# Patient Record
Sex: Male | Born: 1965 | Race: White | Hispanic: No | Marital: Single | Smoking: Never smoker
Health system: Southern US, Community
[De-identification: ages and names within clinical notes are randomized; demographics above are authoritative.]

## PROBLEM LIST (undated history)

## (undated) DIAGNOSIS — I10 Essential (primary) hypertension: Secondary | ICD-10-CM

## (undated) HISTORY — DX: Essential (primary) hypertension: I10

---

## 2002-08-12 ENCOUNTER — Encounter: Payer: Self-pay | Admitting: Family Medicine

## 2002-08-12 ENCOUNTER — Ambulatory Visit (HOSPITAL_COMMUNITY): Admission: RE | Admit: 2002-08-12 | Discharge: 2002-08-12 | Payer: Self-pay | Admitting: Family Medicine

## 2002-11-07 ENCOUNTER — Encounter: Payer: Self-pay | Admitting: Neurosurgery

## 2002-11-09 ENCOUNTER — Encounter: Payer: Self-pay | Admitting: Neurosurgery

## 2002-11-09 ENCOUNTER — Inpatient Hospital Stay (HOSPITAL_COMMUNITY): Admission: RE | Admit: 2002-11-09 | Discharge: 2002-11-10 | Payer: Self-pay | Admitting: Neurosurgery

## 2009-07-09 ENCOUNTER — Ambulatory Visit: Payer: Self-pay | Admitting: Family Medicine

## 2009-07-09 DIAGNOSIS — I10 Essential (primary) hypertension: Secondary | ICD-10-CM | POA: Insufficient documentation

## 2009-07-18 ENCOUNTER — Telehealth: Payer: Self-pay | Admitting: Family Medicine

## 2009-07-25 ENCOUNTER — Ambulatory Visit: Payer: Self-pay | Admitting: Family Medicine

## 2009-07-26 LAB — CONVERTED CEMR LAB
ALT: 172 units/L — ABNORMAL HIGH (ref 0–53)
AST: 92 units/L — ABNORMAL HIGH (ref 0–37)
Albumin: 4.4 g/dL (ref 3.5–5.2)
Alkaline Phosphatase: 59 units/L (ref 39–117)
BUN: 13 mg/dL (ref 6–23)
Basophils Absolute: 0 10*3/uL (ref 0.0–0.1)
Basophils Relative: 0.3 % (ref 0.0–3.0)
Bilirubin, Direct: 0 mg/dL (ref 0.0–0.3)
CO2: 28 meq/L (ref 19–32)
Calcium: 9.3 mg/dL (ref 8.4–10.5)
Chloride: 102 meq/L (ref 96–112)
Cholesterol: 240 mg/dL — ABNORMAL HIGH (ref 0–200)
Creatinine, Ser: 0.9 mg/dL (ref 0.4–1.5)
Direct LDL: 188.8 mg/dL
Eosinophils Absolute: 0.2 10*3/uL (ref 0.0–0.7)
Eosinophils Relative: 3 % (ref 0.0–5.0)
GFR calc non Af Amer: 96.76 mL/min (ref >60)
Glucose, Bld: 101 mg/dL — ABNORMAL HIGH (ref 70–99)
HCT: 45.3 % (ref 39.0–52.0)
HDL: 40.8 mg/dL (ref >39.00)
Hemoglobin: 15 g/dL (ref 13.0–17.0)
Lymphocytes Relative: 46.5 % — ABNORMAL HIGH (ref 12.0–46.0)
Lymphs Abs: 3.1 10*3/uL (ref 0.7–4.0)
MCHC: 33.1 g/dL (ref 30.0–36.0)
MCV: 94.8 fL (ref 78.0–100.0)
Monocytes Absolute: 0.6 10*3/uL (ref 0.1–1.0)
Monocytes Relative: 9 % (ref 3.0–12.0)
Neutro Abs: 2.7 10*3/uL (ref 1.4–7.7)
Neutrophils Relative %: 41.2 % — ABNORMAL LOW (ref 43.0–77.0)
PSA: 0.43 ng/mL (ref 0.10–4.00)
Platelets: 238 10*3/uL (ref 150.0–400.0)
Potassium: 4.3 meq/L (ref 3.5–5.1)
RBC: 4.78 M/uL (ref 4.22–5.81)
RDW: 12.7 % (ref 11.5–14.6)
Sodium: 137 meq/L (ref 135–145)
TSH: 2.68 microintl units/mL (ref 0.35–5.50)
Total Bilirubin: 0.7 mg/dL (ref 0.3–1.2)
Total CHOL/HDL Ratio: 6
Total Protein: 7.6 g/dL (ref 6.0–8.3)
Triglycerides: 136 mg/dL (ref 0.0–149.0)
VLDL: 27.2 mg/dL (ref 0.0–40.0)
WBC: 6.6 10*3/uL (ref 4.5–10.5)

## 2009-09-26 ENCOUNTER — Telehealth: Payer: Self-pay | Admitting: Family Medicine

## 2009-09-28 ENCOUNTER — Ambulatory Visit: Payer: Self-pay | Admitting: Family Medicine

## 2010-02-21 ENCOUNTER — Ambulatory Visit: Payer: Self-pay | Admitting: Family Medicine

## 2010-06-25 NOTE — Progress Notes (Signed)
Summary: Bp meds  Phone Note Call from Patient Call back at [phone]   Caller: Patient Summary of Call: Pts wife called and stated that her husband is out of his medications- he has been taking 2 a day. They would like samples. Is this fine? Army Fossa CMA  July 18, 2009 4:07 PM   Follow-up for Phone Call        Per dr Laury Axon give pt samples of Exforge 10/320 mg and only take 1 a day. Pt is aware that meds are up front.Army Fossa CMA  July 19, 2009 10:29 AM     New/Updated Medications: EXFORGE 10-320 MG TABS (AMLODIPINE BESYLATE-VALSARTAN) 1 by mouth daily

## 2010-06-25 NOTE — Assessment & Plan Note (Signed)
Summary: NEW ACUTE PER LOWNE/CDJ   Vital Signs:  Patient profile:   45 year old male Height:      71 inches Weight:      263 pounds BMI:     36.81 Temp:     97.8 degrees F oral Pulse rate:   82 / minute Pulse rhythm:   regular BP sitting:   178 / 110  (left arm) Cuff size:   large  Vitals Entered By: Army Fossa CMA (July 09, 2009 9:56 AM) CC: Pt here c/o Elevated BP   History of Present Illness:  Hypertension follow-up      This is a 45 year old man who presents for Hypertension follow-up.  The symptoms began 2004 ago.  Pt here to establish and check BP.  BP has been elevated for the last week.  The patient denies lightheadedness, urinary frequency, headaches, edema, impotence, rash, and fatigue.  The patient denies the following associated symptoms: chest pain, chest pressure, exercise intolerance, dyspnea, palpitations, syncope, leg edema, and pedal edema.  Compliance with medications (by patient report) has been near 100%.  The patient reports that dietary compliance has been good.  The patient reports no exercise.  Adjunctive measures currently used by the patient include salt restriction.    Preventive Screening-Counseling & Management  Alcohol-Tobacco     Alcohol drinks/day: 0     Smoking Status: never  Caffeine-Diet-Exercise     Caffeine use/day: 2     Does Patient Exercise: yes     Type of exercise: running      Sexual History:  currently monogamous and married.        Drug Use:  no.    Current Medications (verified): 1)  Lisinopril 20 Mg Tabs (Lisinopril) .Marland Kitchen.. 1 By Mouth Daily  Allergies (verified): No Known Drug Allergies  Past History:  Social History: Last updated: 07/09/2009 Married Never Smoked Alcohol use-yes Drug use-no Regular exercise-yes  Risk Factors: Alcohol Use: 0 (07/09/2009) Caffeine Use: 2 (07/09/2009) Exercise: yes (07/09/2009)  Risk Factors: Smoking Status: never (07/09/2009)  Past Medical History: Hypertension--  pt  had genetic testing at [facility] and was told he had 100% chance of getting htn.    Family History: No family hx of HTN  Social History: Married Never Smoked Alcohol use-yes Drug use-no Regular exercise-yes Smoking Status:  never Drug Use:  no Does Patient Exercise:  yes Caffeine use/day:  2 Sexual History:  currently monogamous, married  Review of Systems      See HPI  Physical Exam  General:  Well-developed,well-nourished,in no acute distress; alert,appropriate and cooperative throughout examination Neck:  No deformities, masses, or tenderness noted. Lungs:  Normal respiratory effort, chest expands symmetrically. Lungs are clear to auscultation, no crackles or wheezes. Heart:  normal rate and no murmur.   Extremities:  No clubbing, cyanosis, edema, or deformity noted with normal full range of motion of all joints.   Psych:  Oriented X3, normally interactive, good eye contact, not anxious appearing, and not depressed appearing.     Impression & Recommendations:  Problem # 1:  HYPERTENSION (ICD-401.9) Assessment Deteriorated  The following medications were removed from the medication list:    Lisinopril 20 Mg Tabs (Lisinopril) .Marland Kitchen... 1 by mouth daily His updated medication list for this problem includes:    Exforge 5-160 Mg Tabs (Amlodipine besylate-valsartan) .Marland Kitchen... 1 by mouth once daily  BP today: 178/110  Orders: EKG w/ Interpretation (93000)  Complete Medication List: 1)  Exforge 5-160 Mg Tabs (Amlodipine besylate-valsartan) .Marland KitchenMarland KitchenMarland Kitchen  1 by mouth once daily  Patient Instructions: 1)  rto 2-3 weeks for ov and fasting labs   EKG  Procedure date:  07/09/2009  Findings:      Normal sinus rhythm with rate of:  71 bpm

## 2010-06-25 NOTE — Assessment & Plan Note (Signed)
Summary: Refill on BP meds/drb   Vital Signs:  Patient profile:   45 year old male Weight:      66.8 pounds Pulse rate:   80 / minute Pulse rhythm:   regular BP sitting:   130 / 80  (right arm) Cuff size:   large  Vitals Entered By: Almeta Monas CMA Duncan Dull) (February 21, 2010 10:09 AM) CC: F/U on BP meds    History of Present Illness:  Hypertension follow-up      This is a 45 year old man who presents for Hypertension follow-up.  The patient denies lightheadedness, urinary frequency, headaches, edema, impotence, rash, and fatigue.  The patient denies the following associated symptoms: chest pain, chest pressure, exercise intolerance, dyspnea, palpitations, syncope, leg edema, and pedal edema.  Compliance with medications (by patient report) has been near 100%.  The patient reports that dietary compliance has been good.  The patient reports exercising occasionally.  Adjunctive measures currently used by the patient include salt restriction.    Current Medications (verified): 1)  Exforge 10-320 Mg Tabs (Amlodipine Besylate-Valsartan) .Marland Kitchen.. 1 By Mouth Daily 2)  Bystolic 5 Mg Tabs (Nebivolol Hcl) .Marland Kitchen.. 1 By Mouth Once Daily  Allergies (verified): No Known Drug Allergies  Past History:  Past medical, surgical, family and social histories (including risk factors) reviewed for relevance to current acute and chronic problems.  Past Medical History: Reviewed history from 07/09/2009 and no changes required. Hypertension--  pt had genetic testing at [facility] and was told he had 100% chance of getting htn.    Family History: Reviewed history from 07/09/2009 and no changes required. No family hx of HTN  Social History: Reviewed history from 07/09/2009 and no changes required. Married Never Smoked Alcohol use-yes Drug use-no Regular exercise-yes  Review of Systems      See HPI  Physical Exam  General:  Well-developed,well-nourished,in no acute distress; alert,appropriate and  cooperative throughout examination Lungs:  Normal respiratory effort, chest expands symmetrically. Lungs are clear to auscultation, no crackles or wheezes. Heart:  Normal rate and regular rhythm. S1 and S2 normal without gallop, murmur, click, rub or other extra sounds. Extremities:  No clubbing, cyanosis, edema, or deformity noted with normal full range of motion of all joints.   Skin:  Intact without suspicious lesions or rashes Psych:  Cognition and judgment appear intact. Alert and cooperative with normal attention span and concentration. No apparent delusions, illusions, hallucinations   Impression & Recommendations:  Problem # 1:  HYPERTENSION (ICD-401.9)  His updated medication list for this problem includes:    Exforge 10-320 Mg Tabs (Amlodipine besylate-valsartan) .Marland Kitchen... 1 by mouth daily    Bystolic 5 Mg Tabs (Nebivolol hcl) .Marland Kitchen... 1 by mouth once daily  BP today: 130/80 Prior BP: 132/90 (09/28/2009)  Labs Reviewed: K+: 4.3 (07/25/2009) Creat: : 0.9 (07/25/2009)   Chol: 240 (07/25/2009)   HDL: 40.80 (07/25/2009)   TG: 136.0 (07/25/2009)  Complete Medication List: 1)  Exforge 10-320 Mg Tabs (Amlodipine besylate-valsartan) .Marland Kitchen.. 1 by mouth daily 2)  Bystolic 5 Mg Tabs (Nebivolol hcl) .Marland Kitchen.. 1 by mouth once daily  Patient Instructions: 1)  Please schedule a follow-up appointment in 6 months .  Prescriptions: BYSTOLIC 5 MG TABS (NEBIVOLOL HCL) 1 by mouth once daily  #30 x 5   Entered and Authorized by:   Loreen Freud DO   Signed by:   Loreen Freud DO on 02/21/2010   Method used:   Electronically to        [facility]  [facility] [id number] (retail)       [address]       [place], [place]  [address]       Ph: [phone]       Fax: [phone]   RxID:   [id number] EXFORGE 10-320 MG TABS (AMLODIPINE BESYLATE-VALSARTAN) 1 by mouth daily  #90 x 3   Entered and Authorized by:   Loreen Freud DO   Signed by:   Loreen Freud DO on 02/21/2010   Method used:    Electronically to        [facility] [id number] (retail)       [address]       [place], [place]  [address]       Ph: [phone]       Fax: [phone]   RxID:   [id number]

## 2010-06-25 NOTE — Assessment & Plan Note (Signed)
Summary: Follow up on BP/drb   Vital Signs:  Patient profile:   45 year old male Weight:      264 pounds Pulse rate:   80 / minute Pulse rhythm:   regular BP sitting:   132 / 90  (left arm) Cuff size:   large  Vitals Entered By: Army Fossa CMA (Sep 28, 2009 10:50 AM) CC: Pt here to follow up on BP meds- runs high when he is not taking both Exforge and Lisnopril   History of Present Illness: Pt here for bp check.  He was taking exforge and lisinopril and we thought he was taking just exforge.  His bp is up with just exforge.  Allergies (verified): No Known Drug Allergies  Physical Exam  General:  Well-developed,well-nourished,in no acute distress; alert,appropriate and cooperative throughout examination Psych:  Oriented X3 and normally interactive.     Impression & Recommendations:  Problem # 1:  HYPERTENSION (ICD-401.9)  His updated medication list for this problem includes:    Exforge 10-320 Mg Tabs (Amlodipine besylate-valsartan) .Marland Kitchen... 1 by mouth daily    Lisinopril 20 Mg Tabs (Lisinopril) .Marland Kitchen... 1 by mouth once daily  BP today: 132/90 Prior BP: 120/78 (07/25/2009)  Labs Reviewed: K+: 4.3 (07/25/2009) Creat: : 0.9 (07/25/2009)   Chol: 240 (07/25/2009)   HDL: 40.80 (07/25/2009)   TG: 136.0 (07/25/2009)  Complete Medication List: 1)  Exforge 10-320 Mg Tabs (Amlodipine besylate-valsartan) .Marland Kitchen.. 1 by mouth daily 2)  Lisinopril 20 Mg Tabs (Lisinopril) .Marland Kitchen.. 1 by mouth once daily  Patient Instructions: 1)  recheck all labs in June---see last labs Prescriptions: LISINOPRIL 20 MG TABS (LISINOPRIL) 1 by mouth once daily  #90 x 3   Entered and Authorized by:   Loreen Freud DO   Signed by:   Loreen Freud DO on 09/28/2009   Method used:   Electronically to        [facility] [address] [id number] (retail)       [address]       [place], [place]  [address]       Ph: [phone]       Fax: [phone]   RxID:   [id number]   Appended Document: Orders  Update need to stop lisinopril---do not want to combine with exforge---change to bystolic 5 mg once daily ---f/u in 1 month   Clinical Lists Changes  Medications: Changed medication from LISINOPRIL 20 MG TABS (LISINOPRIL) 1 by mouth once daily to BYSTOLIC 5 MG TABS (NEBIVOLOL HCL) 1 by mouth once daily - Signed Rx of BYSTOLIC 5 MG TABS (NEBIVOLOL HCL) 1 by mouth once daily;  #30 x 2;  Signed;  Entered by: Loreen Freud DO;  Authorized by: Loreen Freud DO;  Method used: Print then Give to Patient    Prescriptions: BYSTOLIC 5 MG TABS (NEBIVOLOL HCL) 1 by mouth once daily  #30 x 2   Entered and Authorized by:   Loreen Freud DO   Signed by:   Loreen Freud DO on 10/09/2009   Method used:   Print then Give to Patient   RxID:   [id number]

## 2010-06-25 NOTE — Progress Notes (Signed)
Summary: BP MEDS   Phone Note Call from Patient   Caller: Spouse Summary of Call: Pt wife called requesting lisinopril---  he is supposed to just be on exforge .  I d/w wife a few weeks ago.   Initial call taken by: Loreen Freud DO,  Sep 26, 2009 12:11 PM  Follow-up for Phone Call        Left message to call back, need to know exactly what pt is taking. Army Fossa CMA  Sep 26, 2009 12:58 PM   Additional Follow-up for Phone Call Additional follow up Details #1::        LMTCB. Army Fossa CMA  Sep 27, 2009 10:09 AM     Additional Follow-up for Phone Call Additional follow up Details #2::    Pt states that his wife told him that he was to take the Lisinopril and Exforge- pt is aware he is just take Exforge. Army Fossa CMA  Sep 27, 2009 11:50 AM   Additional Follow-up for Phone Call Additional follow up Details #3:: Details for Additional Follow-up Action Taken: he needs f/u appointment   Spoke with pts wife and he says when just taking the Exforge his BP is up. She scheduled appt for him tomorrow. Army Fossa CMA  Sep 27, 2009 12:43 PM  Additional Follow-up by: Loreen Freud DO,  Sep 27, 2009 12:12 PM

## 2010-06-25 NOTE — Assessment & Plan Note (Signed)
Summary: 2-3 week roa//lch   Vital Signs:  Patient profile:   45 year old male Weight:      259 pounds Pulse rate:   88 / minute Pulse rhythm:   regular BP sitting:   120 / 78  (left arm) Cuff size:   large  Vitals Entered By: Army Fossa CMA (July 25, 2009 10:32 AM) CC: Pt here follow up on BP and labwork- pt is fasting   History of Present Illness:  Hypertension follow-up      This is a 45 year old man who presents for Hypertension follow-up.  The patient denies lightheadedness, urinary frequency, headaches, edema, impotence, rash, and fatigue.  The patient denies the following associated symptoms: chest pain, chest pressure, exercise intolerance, dyspnea, palpitations, syncope, leg edema, and pedal edema.  Compliance with medications (by patient report) has been near 100%.  The patient reports that dietary compliance has been good.  The patient reports exercising 3-4X per week.  Adjunctive measures currently used by the patient include salt restriction.    Current Medications (verified): 1)  Exforge 10-320 Mg Tabs (Amlodipine Besylate-Valsartan) .Marland Kitchen.. 1 By Mouth Daily  Allergies (verified): No Known Drug Allergies  Past History:  Past medical, surgical, family and social histories (including risk factors) reviewed for relevance to current acute and chronic problems.  Past Medical History: Reviewed history from 07/09/2009 and no changes required. Hypertension--  pt had genetic testing at [facility] and was told he had 100% chance of getting htn.    Family History: Reviewed history from 07/09/2009 and no changes required. No family hx of HTN  Social History: Reviewed history from 07/09/2009 and no changes required. Married Never Smoked Alcohol use-yes Drug use-no Regular exercise-yes  Review of Systems      See HPI  Physical Exam  General:  Well-developed,well-nourished,in no acute distress; alert,appropriate and cooperative throughout examination Lungs:  Normal  respiratory effort, chest expands symmetrically. Lungs are clear to auscultation, no crackles or wheezes. Heart:  normal rate and no murmur.   Extremities:  No clubbing, cyanosis, edema, or deformity noted with normal full range of motion of all joints.   Skin:  Intact without suspicious lesions or rashes Cervical Nodes:  No lymphadenopathy noted Psych:  Cognition and judgment appear intact. Alert and cooperative with normal attention span and concentration. No apparent delusions, illusions, hallucinations   Impression & Recommendations:  Problem # 1:  HYPERTENSION (ICD-401.9) Assessment Improved  His updated medication list for this problem includes:    Exforge 10-320 Mg Tabs (Amlodipine besylate-valsartan) .Marland Kitchen... 1 by mouth daily  Orders: Venipuncture ([address]) TLB-Lipid Panel (80061-LIPID) TLB-BMP (Basic Metabolic Panel-BMET) (80048-METABOL) TLB-CBC Platelet - w/Differential (85025-CBCD) TLB-Hepatic/Liver Function Pnl (80076-HEPATIC) TLB-TSH (Thyroid Stimulating Hormone) (84443-TSH) TLB-PSA (Prostate Specific Antigen) (84153-PSA)  BP today: 120/78 Prior BP: 178/110 (07/09/2009)  Complete Medication List: 1)  Exforge 10-320 Mg Tabs (Amlodipine besylate-valsartan) .Marland Kitchen.. 1 by mouth daily  Patient Instructions: 1)  Please schedule a follow-up appointment in 3 months .  Prescriptions: EXFORGE 10-320 MG TABS (AMLODIPINE BESYLATE-VALSARTAN) 1 by mouth daily  #30 x 3   Entered and Authorized by:   Loreen Freud DO   Signed by:   Loreen Freud DO on 07/25/2009   Method used:   Electronically to        [facility] [phone]* (retail)       [address]       [place], [place]  [address]  Ph: [phone]       Fax: [phone]   RxID:   [id number]

## 2010-10-04 ENCOUNTER — Other Ambulatory Visit: Payer: Self-pay | Admitting: Family Medicine

## 2010-10-11 NOTE — Op Note (Signed)
NAME:  Brad Ellis, Brad Ellis NO.:  [id number]   MEDICAL RECORD NO.:  [id number]                   PATIENT TYPE:  INP   LOCATION:  2899                                 FACILITY:  [facility]   PHYSICIAN:  Hilda Lias, M.D.                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   PROCEDURE:  Anterior C5-6 diskectomy and decompression of the spinal cord,  bilateral foraminotomy, removal of free fragment on the left side.  Interbody fusion with Allograft from C5-6.  Microscopic.   SURGEON:  Hilda Lias, M.D.   ASSISTANT:  Payton Doughty, M.D.   INDICATIONS:  The patient is being admitted because of neck and left upper  extremity pain.  The patient has failed conservative treatment.  MRI shows a  large herniated disk with displacement of his spinal cord bilaterally, left  worse than right.  Surgery was advised.   DESCRIPTION OF PROCEDURE:  The patient was taken to the OR and he was  positioned in the supine manner.  The neck was prepped with Betadine.  He  had a thick neck.  Incision was made in the left side through the skin,  platysma down to the cervical spine.  We had to do x-ray before we were able  to see that we were at the level of C5-6.  It was difficult to see below C5.  With incision of the ligament, we brought the microscope into the area.  That was the only way we could see well with lights and because of the  thickness of the neck.  With the microcurette, we did a total diskectomy.  We opened the posterior ligament, and there was quite a bit of spondylosis  but to the left side there were three to four large fragments compromising  the C6 level.  Decompression was made.  The posterior leaflet was removed.  A foraminotomy was accomplished bilaterally.  Then the end-plate was  trimmed.  A piece of bone graft in the mid __________ was inserted.  __________ the x-ray with the nerve hook we were able to see that there was  plenty of space between the posterior part of the bone graft and the spinal  cord.  From then on, the area was irrigated.  Hemostasis was obtained.  Lateral C-spine showed the plate was in good position.  From then on, the  area was irrigated and closed with Vicryl and Steri-Strips.  Hilda Lias, M.D.    EB/MEDQ  D:  11/09/2002  T:  11/09/2002  Job:  [id number]

## 2010-10-11 NOTE — H&P (Signed)
NAME:  Brad Ellis, Brad Ellis NO.:  [id number]   MEDICAL RECORD NO.:  [id number]                   PATIENT TYPE:  INP   LOCATION:  NA                                   FACILITY:  [facility]   PHYSICIAN:  Hilda Lias, M.D.                DATE OF BIRTH:  October 28, 1965   DATE OF ADMISSION:  11/09/2002  DATE OF DISCHARGE:                                HISTORY & PHYSICAL   Brad Ellis is a gentleman who came to see me about two months ago in my  office complaining of neck pain with radiation to the left arm muscles at  the level of the deltoid and biceps.  He felt the pain is getting worse.  He  just moved from [place] a few months ago.  On moving his furniture, he  developed a shooting pain down to the left arm associated with pain of the  neck.  He has had conservative treatment.  He had been seen by a physician  at [facility].  An MRI was obtained and because of the persistent pain, he wanted  to proceed with surgery.  The patient had been taking quite a bit of  Percocet and diazepam and despite taking this quantity, he is not any  better.  Finally, he decided he wanted to proceed with the surgical  decompression.   PAST MEDICAL HISTORY:  Negative.   ALLERGIES:  He is not allergic to any medications.   SOCIAL HISTORY:  He drinks socially.  He does not smoke.  He is 5 feet 10  inches, 230 pounds.   FAMILY HISTORY:  Negative.   REVIEW OF SYSTEMS:  Positive for neck and left arm pain.   PHYSICAL EXAMINATION:  HEENT:  Head is normal.  NECK:  He is able to flex but extension causes pain going to the left  shoulder.  LUNGS:  Clear.  HEART:  Sounds normal with normal activity and normal pulse.  _________.  EXTREMITIES:  Normal except I can break easily the left biceps and the left  __________ 3/5.  SENSATION:  Complains of a tingling sensation of the left thumb and index  finger.  REFLEXES:  Symmetrical but absent in the left biceps.  COORDINATION:  Gait  normal.   MRI showed that indeed he has a right herniated disk at the level of 5-6  with the canal being 8 mm.   IMPRESSION:  The patient is being admitted for surgery.  Procedure will be  anterior cervical diskectomy at C5-6 with a bone graft and plate and screws.  He knows about the risks of infection, CSF leak, worsening pain, damage to  the vocal cord, damage to the carotid and vertebral arteries, failure of the  bone graft, need for __________ surgery.  Hilda Lias, M.D.   EB/MEDQ  D:  11/09/2002  T:  11/09/2002  Job:  [id number]

## 2011-03-22 ENCOUNTER — Other Ambulatory Visit: Payer: Self-pay | Admitting: Family Medicine

## 2011-03-24 NOTE — Telephone Encounter (Signed)
Letter mailed     KP 

## 2013-02-21 ENCOUNTER — Telehealth: Payer: Self-pay | Admitting: Family Medicine

## 2013-02-21 ENCOUNTER — Other Ambulatory Visit: Payer: Self-pay | Admitting: Family Medicine

## 2013-02-21 DIAGNOSIS — I10 Essential (primary) hypertension: Secondary | ICD-10-CM

## 2013-02-21 MED ORDER — NEBIVOLOL HCL 5 MG PO TABS: ORAL_TABLET | ORAL | Status: DC

## 2013-02-21 MED ORDER — AMLODIPINE BESYLATE-VALSARTAN 10-320 MG PO TABS: ORAL_TABLET | ORAL | Status: DC

## 2013-02-21 NOTE — Telephone Encounter (Signed)
Patient's wife is calling in regards to the patient Bystolic BP medication. She is asking for it to be refilled. Advised her that the patient had not been seen since 2011 and provider would most likely decline any refills until patient is seen. Wife laughed and says, "well I need you to ask her for me." They are leaving for a funeral this afternoon and patient cannot come in until next week. Please advise.

## 2013-02-21 NOTE — Telephone Encounter (Signed)
Letter mailed to schedule a CPE.      KP 

## 2013-02-27 ENCOUNTER — Other Ambulatory Visit: Payer: Self-pay | Admitting: Family Medicine

## 2013-02-27 DIAGNOSIS — I10 Essential (primary) hypertension: Secondary | ICD-10-CM

## 2013-02-27 MED ORDER — AMLODIPINE BESYLATE-VALSARTAN 10-320 MG PO TABS: ORAL_TABLET | ORAL | Status: DC

## 2013-03-02 ENCOUNTER — Ambulatory Visit (INDEPENDENT_AMBULATORY_CARE_PROVIDER_SITE_OTHER): Payer: BC Managed Care – PPO | Admitting: Family Medicine

## 2013-03-02 ENCOUNTER — Encounter: Payer: Self-pay | Admitting: Family Medicine

## 2013-03-02 VITALS — BP 150/96 | HR 78 | Temp 98.1°F | Wt 229.2 lb

## 2013-03-02 DIAGNOSIS — Z23 Encounter for immunization: Secondary | ICD-10-CM

## 2013-03-02 DIAGNOSIS — I10 Essential (primary) hypertension: Secondary | ICD-10-CM

## 2013-03-02 MED ORDER — NEBIVOLOL HCL 10 MG PO TABS: 10.0000 mg | ORAL_TABLET | Freq: Every day | ORAL | Status: DC

## 2013-03-02 NOTE — Patient Instructions (Signed)

## 2013-03-02 NOTE — Progress Notes (Signed)
  Subjective:    Patient here for follow-up of elevated blood pressure.  He is exercising and is adherent to a low-salt diet.  Blood pressure is not well controlled at home. Cardiac symptoms: none. Patient denies: chest pain, chest pressure/discomfort, claudication, dyspnea, exertional chest pressure/discomfort, fatigue, irregular heart beat, lower extremity edema, near-syncope, orthopnea, palpitations, paroxysmal nocturnal dyspnea, syncope and tachypnea. Cardiovascular risk factors: hypertension, male gender and obesity (BMI >= 30 kg/m2). Use of agents associated with hypertension: none. History of target organ damage: none.  The following portions of the patient's history were reviewed and updated as appropriate: allergies, current medications, past family history, past medical history, past social history, past surgical history and problem list.  Review of Systems Pertinent items are noted in HPI.     Objective:    BP 150/96  Pulse 78  Temp(Src) 98.1 F (36.7 C) (Oral)  Wt 229 lb 3.2 oz (103.964 kg)  BMI 31.98 kg/m2  SpO2 98% General appearance: alert, cooperative, appears stated age and no distress Head: Normocephalic, without obvious abnormality, atraumatic Neck: no adenopathy, no carotid bruit, no JVD, supple, symmetrical, trachea midline and thyroid not enlarged, symmetric, no tenderness/mass/nodules Lungs: clear to auscultation bilaterally Heart: S1, S2 normal Extremities: extremities normal, atraumatic, no cyanosis or edema    Assessment:    Hypertension, stage 1 . Evidence of target organ damage: none.    Plan:    Medication: continue exforge and increase to bystolic 10. Dietary sodium restriction. Regular aerobic exercise. Check blood pressures 2-3 times weekly and record. Follow up: 3 months and as needed.

## 2013-10-07 ENCOUNTER — Telehealth: Payer: Self-pay | Admitting: Family Medicine

## 2013-10-07 NOTE — Telephone Encounter (Signed)
We currently do not have any coupons available for Bystolic.  Mrs. Brad Ellis was made aware. No further questions or concerns voiced.

## 2013-10-07 NOTE — Telephone Encounter (Signed)
Caller name: Darol Destinelizabeth Lowdermilk Relation to pt: spouse Call back number: [phone] Pharmacy:  Reason for call:  Patient's wife called to see if we have any coupons available for Bystolic for the patient.

## 2013-10-07 NOTE — Telephone Encounter (Signed)
Pt returned call. Please contact again.

## 2017-05-05 ENCOUNTER — Other Ambulatory Visit: Payer: Self-pay

## 2017-05-05 ENCOUNTER — Telehealth: Payer: Self-pay | Admitting: Family Medicine

## 2017-05-05 DIAGNOSIS — I10 Essential (primary) hypertension: Secondary | ICD-10-CM

## 2017-05-05 MED ORDER — AMLODIPINE BESYLATE-VALSARTAN 10-320 MG PO TABS: ORAL_TABLET | ORAL | 0 refills | Status: DC

## 2017-05-05 MED ORDER — NEBIVOLOL HCL 10 MG PO TABS: 10.0000 mg | ORAL_TABLET | Freq: Every day | ORAL | 0 refills | Status: DC

## 2017-05-05 NOTE — Progress Notes (Signed)
Pt. called to report the medication that was to be sent to the pharmacy didn't go through.  [facility]; advised their electronic Rx is not working properly.  Gave verbal order for Amlodipine-Valsartan and Nebivolol; see medication list for complete Rx. information.

## 2017-05-05 NOTE — Telephone Encounter (Signed)
Copied from CRM [id number]. Topic: Quick Communication - See Telephone Encounter >> May 05, 2017  8:51 AM Cipriano BunkerLambe, Annette S wrote: CRM for notification. See Telephone encounter for:  Patient is out of high blood pressure -  Metotrolol 2-25mg  a day and xforge 325mg  once a day  Has an appt 12/17    need some until then [id number] - [place], [place] - [address] [place] [phone]

## 2017-05-05 NOTE — Progress Notes (Signed)
Pt. Has an appointment for this coming Monday. Refills for BP medications for 14 days only.

## 2017-05-06 ENCOUNTER — Telehealth: Payer: Self-pay

## 2017-05-06 NOTE — Telephone Encounter (Signed)
Pt has not been seen in a few years

## 2017-05-06 NOTE — Telephone Encounter (Signed)
PA denied- formulary alternatives: atenolol, carvedilol, metoprolol, etc. Please advise.

## 2017-05-06 NOTE — Telephone Encounter (Signed)
PA initiated via Covermymeds; KEY: [id number]. Awaiting determination.

## 2017-05-07 MED ORDER — CARVEDILOL 6.25 MG PO TABS: 6.2500 mg | ORAL_TABLET | Freq: Two times a day (BID) | ORAL | 0 refills | Status: DC

## 2017-05-07 NOTE — Telephone Encounter (Signed)
Pt has appt scheduled 05/11/2017 w/ you.

## 2017-05-07 NOTE — Telephone Encounter (Signed)
Rx for carvedilol 6.25mg  bid #60 and [facility]rf [facility]. Bystolic d/c from med list.

## 2017-05-07 NOTE — Telephone Encounter (Signed)
carvediolol 6.25 bid #60

## 2017-05-11 ENCOUNTER — Ambulatory Visit: Payer: Self-pay | Admitting: Family Medicine

## 2017-05-20 ENCOUNTER — Other Ambulatory Visit: Payer: Self-pay

## 2017-05-20 ENCOUNTER — Telehealth: Payer: Self-pay | Admitting: Family Medicine

## 2017-05-20 DIAGNOSIS — I10 Essential (primary) hypertension: Secondary | ICD-10-CM

## 2017-05-20 MED ORDER — AMLODIPINE BESYLATE-VALSARTAN 10-320 MG PO TABS: ORAL_TABLET | ORAL | 0 refills | Status: DC

## 2017-05-20 NOTE — Telephone Encounter (Signed)
Copied from CRM [id number]. Topic: Quick Communication - Rx Refill/Question >> May 20, 2017  1:04 PM Brad Ellis, Brad Ellis wrote: Has the patient contacted their pharmacy? Yes (Agent: If no, request that the patient contact the pharmacy for the refill.) Preferred Pharmacy (with phone number or street name): [phone] - [place], [place] - [address] [facility] [address] Agent: Please be advised that RX refills may take up to 3 business days. We ask that you follow-up with your pharmacy. Patient needs refill on his amLODipine-valsartan (EXFORGE) 10-320 MG tablet. He has an appt on 05/28/17. Patient would like to get enough to last him until his next appt.

## 2017-05-21 MED ORDER — AMLODIPINE BESYLATE-VALSARTAN 10-320 MG PO TABS: ORAL_TABLET | ORAL | 0 refills | Status: DC

## 2017-05-21 NOTE — Telephone Encounter (Addendum)
Caller name:  [facility]ELIZABETH Relation to pt: spouse  Call back number: [phone]  [facility]acy:  [id number] [place] - [id number] [facility] [address] [phone] (Phone) [phone] (Fax)    Reason for call:  Spouse checking on the status of the message below, informed spouse please allow 48 to 72 hour turn around time, spouse voice understanding stating he's completely out, please advise

## 2017-05-21 NOTE — Addendum Note (Signed)
Addended by: Thelma BargeICHARDSON, Kule Gascoigne D on: 05/21/2017 09:14 AM   Modules accepted: Orders

## 2017-05-21 NOTE — Telephone Encounter (Signed)
Left message on machine that rx has been sent in. 

## 2017-05-22 ENCOUNTER — Telehealth: Payer: Self-pay | Admitting: Family Medicine

## 2017-05-22 NOTE — Telephone Encounter (Signed)
Pharmacy reports they had medication on back order, but it is in now, if pt. Wants to pick up the rest. Wife notified.

## 2017-05-22 NOTE — Telephone Encounter (Signed)
Copied from CRM [id number]. Topic: Quick Communication - See Telephone Encounter >> May 22, 2017  1:07 PM Waymon AmatoBurton, Donna F wrote: CRM for notification. See Telephone encounter for:  Pt called yesterday and requested enough of exforge to him to his appt on the 05-28-17 but pt wife is calling back stating that he did not get enough he needs about 3 more pills to get him thru   Best [phone]  05/22/17.

## 2017-05-28 ENCOUNTER — Encounter: Payer: Self-pay | Admitting: Family Medicine

## 2017-05-28 ENCOUNTER — Ambulatory Visit (INDEPENDENT_AMBULATORY_CARE_PROVIDER_SITE_OTHER): Payer: BLUE CROSS/BLUE SHIELD | Admitting: Family Medicine

## 2017-05-28 VITALS — BP 150/96 | HR 71 | Temp 98.1°F | Resp 16 | Ht 71.0 in | Wt 280.6 lb

## 2017-05-28 DIAGNOSIS — G4700 Insomnia, unspecified: Secondary | ICD-10-CM | POA: Diagnosis not present

## 2017-05-28 DIAGNOSIS — Z0000 Encounter for general adult medical examination without abnormal findings: Secondary | ICD-10-CM | POA: Diagnosis not present

## 2017-05-28 DIAGNOSIS — I10 Essential (primary) hypertension: Secondary | ICD-10-CM

## 2017-05-28 MED ORDER — ALPRAZOLAM 0.25 MG PO TABS
0.2500 mg | ORAL_TABLET | Freq: Every day | ORAL | 1 refills | Status: DC
Start: 2017-05-28 — End: 2017-06-18

## 2017-05-28 MED ORDER — HYDROCHLOROTHIAZIDE 25 MG PO TABS: 25.0000 mg | ORAL_TABLET | Freq: Every day | ORAL | 3 refills | Status: DC

## 2017-05-28 NOTE — Patient Instructions (Signed)

## 2017-05-28 NOTE — Progress Notes (Signed)
Patient ID: Brad Ellis, male   DOB: 12/12/1965, 52 y.o.   MRN: [id number]     Subjective:  I acted as a Neurosurgeon for Dr. Zola Button.  Apolonio Schneiders, CMA   Patient ID: Brad Ellis, male    DOB: 10/03/1965, 52 y.o.   MRN: [id number]  Chief Complaint  Patient presents with  . Hypertension    HPI  Patient is in today for follow up blood pressure.  He has been checking at home and has been running around 135/86 or lower.  Patient Care Team: Zola Button, Grayling Congress, DO as PCP - General   Past Medical History:  Diagnosis Date  . HTN (hypertension)     No past surgical history on file.  History reviewed. No pertinent family history.  Social History   Socioeconomic History  . Marital status: Single    Spouse name: Not on file  . Number of children: Not on file  . Years of education: Not on file  . Highest education level: Not on file  Social Needs  . Financial resource strain: Not on file  . Food insecurity - worry: Not on file  . Food insecurity - inability: Not on file  . Transportation needs - medical: Not on file  . Transportation needs - non-medical: Not on file  Occupational History  . Not on file  Tobacco Use  . Smoking status: Never Smoker  . Smokeless tobacco: Never Used  Substance and Sexual Activity  . Alcohol use: No  . Drug use: No  . Sexual activity: Not on file  Other Topics Concern  . Not on file  Social History Narrative  . Not on file    Outpatient Medications Prior to Visit  Medication Sig Dispense Refill  . amLODipine-valsartan (EXFORGE) 10-320 MG tablet TAKE ONE TABLET BY MOUTH ONE TIME DAILY 30 tablet 0  . carvedilol (COREG) 6.25 MG tablet Take 1 tablet (6.25 mg total) by mouth 2 (two) times daily with a meal. 60 tablet 0   No facility-administered medications prior to visit.     No Known Allergies  Review of Systems  Constitutional: Negative for fever and malaise/fatigue.  HENT: Negative for congestion.   Eyes: Negative for blurred vision.    Respiratory: Negative for cough and shortness of breath.   Cardiovascular: Negative for chest pain, palpitations and leg swelling.  Gastrointestinal: Negative for vomiting.  Musculoskeletal: Negative for back pain.  Skin: Negative for rash.  Neurological: Negative for loss of consciousness and headaches.       Objective:    Physical Exam  Constitutional: He is oriented to person, place, and time. Vital signs are normal. He appears well-developed and well-nourished. He is sleeping.  HENT:  Head: Normocephalic and atraumatic.  Mouth/Throat: Oropharynx is clear and moist.  Eyes: EOM are normal. Pupils are equal, round, and reactive to light.  Neck: Normal range of motion. Neck supple. No thyromegaly present.  Cardiovascular: Normal rate and regular rhythm.  No murmur heard. Pulmonary/Chest: Effort normal and breath sounds normal. No respiratory distress. He has no wheezes. He has no rales. He exhibits no tenderness.  Musculoskeletal: He exhibits no edema or tenderness.  Neurological: He is alert and oriented to person, place, and time.  Skin: Skin is warm and dry.  Psychiatric: He has a normal mood and affect. His behavior is normal. Judgment and thought content normal.  Nursing note and vitals reviewed.   BP (!) 150/96   Pulse 71   Temp 98.1 F (36.7 C) (  Oral)   Resp 16   Ht 5\' 11"  (1.803 m)   Wt 280 lb 9.6 oz (127.3 kg)   SpO2 95%   BMI 39.14 kg/m  Wt Readings from Last 3 Encounters:  05/28/17 280 lb 9.6 oz (127.3 kg)  03/02/13 229 lb 3.2 oz (104 kg)   BP Readings from Last 3 Encounters:  05/28/17 (!) 150/96  03/02/13 (!) 150/96     Immunization History  Administered Date(s) Administered  . Influenza,inj,Quad PF,6+ Mos 03/02/2013    Health Maintenance  Topic Date Due  . Janet BerlinETANUS/TDAP  01/12/1983  . COLONOSCOPY  01/11/2014  . INFLUENZA VACCINE  12/24/2016  . Hepatitis C Screening  05/29/2027 (Originally 25-Mar-1966)  . HIV Screening  05/29/2027 (Originally  01/12/1979)    Lab Results  Component Value Date   WBC 6.6 07/25/2009   HGB 15.0 07/25/2009   HCT 45.3 07/25/2009   PLT 238.0 07/25/2009   GLUCOSE 101 (H) 07/25/2009   CHOL 240 (H) 07/25/2009   TRIG 136.0 07/25/2009   HDL 40.80 07/25/2009   LDLDIRECT 188.8 07/25/2009   ALT 172 (H) 07/25/2009   AST 92 (H) 07/25/2009   NA 137 07/25/2009   K 4.3 07/25/2009   CL 102 07/25/2009   CREATININE 0.9 07/25/2009   BUN 13 07/25/2009   CO2 28 07/25/2009   TSH 2.68 07/25/2009   PSA 0.43 07/25/2009    Lab Results  Component Value Date   TSH 2.68 07/25/2009   Lab Results  Component Value Date   WBC 6.6 07/25/2009   HGB 15.0 07/25/2009   HCT 45.3 07/25/2009   MCV 94.8 07/25/2009   PLT 238.0 07/25/2009   Lab Results  Component Value Date   NA 137 07/25/2009   K 4.3 07/25/2009   CO2 28 07/25/2009   GLUCOSE 101 (H) 07/25/2009   BUN 13 07/25/2009   CREATININE 0.9 07/25/2009   BILITOT 0.7 07/25/2009   ALKPHOS 59 07/25/2009   AST 92 (H) 07/25/2009   ALT 172 (H) 07/25/2009   PROT 7.6 07/25/2009   ALBUMIN 4.4 07/25/2009   CALCIUM 9.3 07/25/2009   Lab Results  Component Value Date   CHOL 240 (H) 07/25/2009   Lab Results  Component Value Date   HDL 40.80 07/25/2009   No results found for: [facility] Lab Results  Component Value Date   TRIG 136.0 07/25/2009   Lab Results  Component Value Date   CHOLHDL 6 07/25/2009   No results found for: HGBA1C       Assessment & Plan:   Problem List Items Addressed This Visit    None    Visit Diagnoses    Insomnia, unspecified type    -  Primary   Relevant Medications   ALPRAZolam (XANAX) 0.25 MG tablet   Essential hypertension       Relevant Medications   hydrochlorothiazide (HYDRODIURIL) 25 MG tablet   Other Relevant Orders   Lipid panel   Comprehensive metabolic panel   Preventative health care       Relevant Orders   Ambulatory referral to Gastroenterology      I am having Tomie ChinaJohn S. Thurow start on ALPRAZolam and  hydrochlorothiazide. I am also having him maintain his carvedilol and amLODipine-valsartan.  Meds ordered this encounter  Medications  . ALPRAZolam (XANAX) 0.25 MG tablet    Sig: Take 1 tablet (0.25 mg total) by mouth at bedtime.    Dispense:  30 tablet    Refill:  1  . hydrochlorothiazide (HYDRODIURIL) 25 MG tablet  Sig: Take 1 tablet (25 mg total) by mouth daily.    Dispense:  90 tablet    Refill:  3    CMA served as scribe during this visit. History, Physical and Plan performed by medical provider. Documentation and orders reviewed and attested to.  Donato Schultz, DO

## 2017-05-29 ENCOUNTER — Other Ambulatory Visit: Payer: Self-pay | Admitting: Family Medicine

## 2017-05-29 ENCOUNTER — Telehealth: Payer: Self-pay | Admitting: *Deleted

## 2017-05-29 DIAGNOSIS — I10 Essential (primary) hypertension: Secondary | ICD-10-CM

## 2017-05-29 MED ORDER — CARVEDILOL 12.5 MG PO TABS
12.5000 mg | ORAL_TABLET | Freq: Two times a day (BID) | ORAL | 3 refills | Status: DC
Start: 2017-05-29 — End: 2017-12-24

## 2017-05-29 NOTE — Telephone Encounter (Signed)
Left detailed message on machine that new rx was sent in for inc dose

## 2017-05-29 NOTE — Telephone Encounter (Signed)
-----   Message from Donato SchultzYvonne R Lowne Chase, DO sent at 05/29/2017  9:30 AM EST ----- Please call pt and let him know I inc coreg to 12.5 mg bid He called his wife while I was seeing her today and said his bp was running high

## 2017-05-29 NOTE — Assessment & Plan Note (Signed)
Pt called today 05/29/2017 -- bp running high and he now is agreeable to inc meds Coreg inc to 12.5 bid

## 2017-06-17 ENCOUNTER — Telehealth: Payer: Self-pay | Admitting: Family Medicine

## 2017-06-17 DIAGNOSIS — I10 Essential (primary) hypertension: Secondary | ICD-10-CM

## 2017-06-17 NOTE — Telephone Encounter (Signed)
Copied from CRM [phone]. Topic: Quick Communication - Rx Refill/Question >> Jun 17, 2017  2:07 PM Jolayne Hainesaylor, Brittany L wrote: Medication: ALPRAZolam Prudy Feeler(XANAX) 0.25 MG tablet (has been taking 2 some nights & and 1 some nights)  & amLODipine-valsartan (EXFORGE) 10-320 MG tablet  Has the patient contacted their pharmacy? yes   (Agent: If no, request that the patient contact the pharmacy for the refill.)   Preferred Pharmacy (with phone number or street name): [phone] - [place], [place] - [address] [facility] [address]     Agent: Please be advised that RX refills may take up to 3 business days. We ask that you follow-up with your pharmacy.

## 2017-06-17 NOTE — Telephone Encounter (Signed)
Medication refill. Last office visit 05/28/17. Thanks.

## 2017-06-18 ENCOUNTER — Other Ambulatory Visit: Payer: Self-pay | Admitting: Family Medicine

## 2017-06-18 DIAGNOSIS — F419 Anxiety disorder, unspecified: Secondary | ICD-10-CM

## 2017-06-18 MED ORDER — ALPRAZOLAM 0.5 MG PO TABS: ORAL_TABLET | ORAL | 1 refills | Status: DC

## 2017-06-18 MED ORDER — AMLODIPINE BESYLATE-VALSARTAN 10-320 MG PO TABS: ORAL_TABLET | ORAL | 1 refills | Status: DC

## 2017-06-18 NOTE — Telephone Encounter (Signed)
Patient stated that he has been taking between 1 and 2 tablets a night.  You advised him at last visit to take no more than 3 tabs.  He would like a increase in dose like 0.5mg  1/2 to 1 tab a night?

## 2017-06-18 NOTE — Telephone Encounter (Signed)
Sent in #60 0.5 mg 1/2-1 po bid prn

## 2017-06-19 NOTE — Telephone Encounter (Signed)
Patient notified

## 2017-07-03 ENCOUNTER — Encounter: Payer: Self-pay | Admitting: Family Medicine

## 2017-09-21 ENCOUNTER — Other Ambulatory Visit: Payer: Self-pay | Admitting: Family Medicine

## 2017-09-21 DIAGNOSIS — F419 Anxiety disorder, unspecified: Secondary | ICD-10-CM

## 2017-09-22 NOTE — Telephone Encounter (Signed)
[facility]  Database ran and is on your desk for review.  Last filled per database: 08/05/17  Last written: 06/18/17 Last ov: 05/28/17 Next ov: none Contract: none UDS: none

## 2017-11-23 ENCOUNTER — Other Ambulatory Visit: Payer: Self-pay | Admitting: Family Medicine

## 2017-11-23 DIAGNOSIS — F419 Anxiety disorder, unspecified: Secondary | ICD-10-CM

## 2017-11-24 NOTE — Telephone Encounter (Signed)
Database ran 09/22/17 and is in media for review   Last written: 08/24/17 Last ov: 05/28/17 Next ov: none Contract: none BJY:NWGNDS:none

## 2017-11-24 NOTE — Telephone Encounter (Signed)
I have written but needs UDS and contract moving forward

## 2017-11-25 ENCOUNTER — Telehealth: Payer: Self-pay

## 2017-11-25 NOTE — Telephone Encounter (Signed)
Author phoned pt. regarding need to have an OV before additional xanax refills are given per Dr. Mariel AloeBlyth's note. No answer, author left VM to call [phone] to make an appointment with Dr. Laury AxonLowne within the next month.

## 2017-12-20 ENCOUNTER — Other Ambulatory Visit: Payer: Self-pay | Admitting: Family Medicine

## 2017-12-20 DIAGNOSIS — I10 Essential (primary) hypertension: Secondary | ICD-10-CM

## 2017-12-24 ENCOUNTER — Encounter: Payer: Self-pay | Admitting: Family Medicine

## 2017-12-24 ENCOUNTER — Ambulatory Visit (INDEPENDENT_AMBULATORY_CARE_PROVIDER_SITE_OTHER): Payer: BLUE CROSS/BLUE SHIELD | Admitting: Family Medicine

## 2017-12-24 VITALS — BP 148/81 | Temp 97.8°F | Ht 71.0 in | Wt 280.4 lb

## 2017-12-24 DIAGNOSIS — I10 Essential (primary) hypertension: Secondary | ICD-10-CM

## 2017-12-24 DIAGNOSIS — F419 Anxiety disorder, unspecified: Secondary | ICD-10-CM | POA: Diagnosis not present

## 2017-12-24 DIAGNOSIS — F321 Major depressive disorder, single episode, moderate: Secondary | ICD-10-CM

## 2017-12-24 DIAGNOSIS — R600 Localized edema: Secondary | ICD-10-CM

## 2017-12-24 LAB — CBC WITH DIFFERENTIAL/PLATELET
BASOS PCT: 0.4 % (ref 0.0–3.0)
Basophils Absolute: 0 10*3/uL (ref 0.0–0.1)
EOS ABS: 0.3 10*3/uL (ref 0.0–0.7)
Eosinophils Relative: 4.7 % (ref 0.0–5.0)
HCT: 45.2 % (ref 39.0–52.0)
Hemoglobin: 15.5 g/dL (ref 13.0–17.0)
LYMPHS ABS: 3.3 10*3/uL (ref 0.7–4.0)
Lymphocytes Relative: 45.7 % (ref 12.0–46.0)
MCHC: 34.3 g/dL (ref 30.0–36.0)
MCV: 93.3 fl (ref 78.0–100.0)
MONO ABS: 0.6 10*3/uL (ref 0.1–1.0)
Monocytes Relative: 8.7 % (ref 3.0–12.0)
NEUTROS PCT: 40.5 % — AB (ref 43.0–77.0)
Neutro Abs: 2.9 10*3/uL (ref 1.4–7.7)
Platelets: 242 10*3/uL (ref 150.0–400.0)
RBC: 4.84 Mil/uL (ref 4.22–5.81)
RDW: 14.3 % (ref 11.5–15.5)
WBC: 7.2 10*3/uL (ref 4.0–10.5)

## 2017-12-24 LAB — LIPID PANEL
Cholesterol: 230 mg/dL — ABNORMAL HIGH (ref 0–200)
HDL: 45.2 mg/dL (ref >39.00)
LDL CALC: 167 mg/dL — AB (ref 0–99)
NONHDL: 184.69
Total CHOL/HDL Ratio: 5
Triglycerides: 89 mg/dL (ref 0.0–149.0)
VLDL: 17.8 mg/dL (ref 0.0–40.0)

## 2017-12-24 LAB — COMPREHENSIVE METABOLIC PANEL
ALT: 177 U/L — ABNORMAL HIGH (ref 0–53)
AST: 91 U/L — AB (ref 0–37)
Albumin: 4.9 g/dL (ref 3.5–5.2)
Alkaline Phosphatase: 52 U/L (ref 39–117)
BUN: 13 mg/dL (ref 6–23)
CHLORIDE: 97 meq/L (ref 96–112)
CO2: 32 meq/L (ref 19–32)
CREATININE: 0.99 mg/dL (ref 0.40–1.50)
Calcium: 10.8 mg/dL — ABNORMAL HIGH (ref 8.4–10.5)
GFR: 83.74 mL/min (ref >60.00)
GLUCOSE: 121 mg/dL — AB (ref 70–99)
POTASSIUM: 4.3 meq/L (ref 3.5–5.1)
SODIUM: 136 meq/L (ref 135–145)
Total Bilirubin: 0.8 mg/dL (ref 0.2–1.2)
Total Protein: 7.9 g/dL (ref 6.0–8.3)

## 2017-12-24 LAB — TSH: TSH: 2.12 u[IU]/mL (ref 0.35–4.50)

## 2017-12-24 LAB — VITAMIN B12: Vitamin B-12: 344 pg/mL (ref 211–911)

## 2017-12-24 MED ORDER — AMLODIPINE BESYLATE-VALSARTAN 10-320 MG PO TABS: ORAL_TABLET | ORAL | 1 refills | Status: DC

## 2017-12-24 MED ORDER — ESCITALOPRAM OXALATE 20 MG PO TABS
20.0000 mg | ORAL_TABLET | Freq: Every day | ORAL | 2 refills | Status: DC
Start: 2017-12-24 — End: 2018-03-22

## 2017-12-24 MED ORDER — HYDROCHLOROTHIAZIDE 25 MG PO TABS: 25.0000 mg | ORAL_TABLET | Freq: Every day | ORAL | 3 refills | Status: DC

## 2017-12-24 MED ORDER — ALPRAZOLAM 0.5 MG PO TABS
0.2500 mg | ORAL_TABLET | Freq: Two times a day (BID) | ORAL | 0 refills | Status: DC | PRN
Start: 2017-12-24 — End: 2018-02-01

## 2017-12-24 MED ORDER — CARVEDILOL 12.5 MG PO TABS: 12.5000 mg | ORAL_TABLET | Freq: Two times a day (BID) | ORAL | 1 refills | Status: DC

## 2017-12-24 MED ORDER — FUROSEMIDE 40 MG PO TABS: 40.0000 mg | ORAL_TABLET | Freq: Every day | ORAL | 3 refills | Status: DC

## 2017-12-24 NOTE — Assessment & Plan Note (Signed)
Stable with prn xanax Refill meds rto 6 months

## 2017-12-24 NOTE — Progress Notes (Signed)
Patient ID: Brad Ellis, male    DOB: 1966-04-16  Age: 52 y.o. MRN: [id number]    Subjective:  Subjective  HPI Brad Ellis presents for f/u bp and cholesterol.  He also needs refills.  Stress at home is not getting better  He is interested in starting something for depression.  He is not suicidal  Review of Systems  Constitutional: Negative for chills and fever.  HENT: Negative for congestion and hearing loss.   Eyes: Negative for discharge.  Respiratory: Negative for cough and shortness of breath.   Cardiovascular: Negative for chest pain, palpitations and leg swelling.  Gastrointestinal: Negative for abdominal pain, blood in stool, constipation, diarrhea, nausea and vomiting.  Genitourinary: Negative for dysuria, frequency, hematuria and urgency.  Musculoskeletal: Negative for back pain and myalgias.  Skin: Negative for rash.  Allergic/Immunologic: Negative for environmental allergies.  Neurological: Negative for dizziness, weakness and headaches.  Hematological: Does not bruise/bleed easily.  Psychiatric/Behavioral: Positive for decreased concentration and dysphoric mood. Negative for self-injury, sleep disturbance and suicidal ideas. The patient is nervous/anxious.     History Past Medical History:  Diagnosis Date  . HTN (hypertension)     He has no past surgical history on file.   His family history is not on file.He reports that he has never smoked. He has never used smokeless tobacco. He reports that he does not drink alcohol or use drugs.  No current outpatient medications on file prior to visit.   No current facility-administered medications on file prior to visit.      Objective:  Objective  Physical Exam  Constitutional: He is oriented to person, place, and time. Vital signs are normal. He appears well-developed and well-nourished. He is sleeping.  HENT:  Head: Normocephalic and atraumatic.  Mouth/Throat: Oropharynx is clear and moist.  Eyes: Pupils are  equal, round, and reactive to light. EOM are normal.  Neck: Normal range of motion. Neck supple. No thyromegaly present.  Cardiovascular: Normal rate and regular rhythm.  No murmur heard. Pulmonary/Chest: Effort normal and breath sounds normal. No respiratory distress. He has no wheezes. He has no rales. He exhibits no tenderness.  Musculoskeletal: He exhibits no edema or tenderness.  Neurological: He is alert and oriented to person, place, and time.  Skin: Skin is warm and dry.  Psychiatric: His behavior is normal. Judgment and thought content normal. His mood appears anxious. Cognition and memory are normal. He exhibits a depressed mood.  Nursing note and vitals reviewed.  BP (!) 148/81 (BP Location: Left Arm, Patient Position: Sitting)   Temp 97.8 F (36.6 C) (Oral)   Ht 5\' 11"  (1.803 m)   Wt 280 lb 6.4 oz (127.2 kg)   SpO2 97%   BMI 39.11 kg/m  Wt Readings from Last 3 Encounters:  12/24/17 280 lb 6.4 oz (127.2 kg)  05/28/17 280 lb 9.6 oz (127.3 kg)  03/02/13 229 lb 3.2 oz (104 kg)     Lab Results  Component Value Date   WBC 6.6 07/25/2009   HGB 15.0 07/25/2009   HCT 45.3 07/25/2009   PLT 238.0 07/25/2009   GLUCOSE 101 (H) 07/25/2009   CHOL 240 (H) 07/25/2009   TRIG 136.0 07/25/2009   HDL 40.80 07/25/2009   LDLDIRECT 188.8 07/25/2009   ALT 172 (H) 07/25/2009   AST 92 (H) 07/25/2009   NA 137 07/25/2009   K 4.3 07/25/2009   CL 102 07/25/2009   CREATININE 0.9 07/25/2009   BUN 13 07/25/2009   CO2 28 07/25/2009  TSH 2.68 07/25/2009   PSA 0.43 07/25/2009    No results found.   Assessment & Plan:  Plan  I have discontinued Brad Ellis's hydrochlorothiazide and hydrochlorothiazide. I have also changed his ALPRAZolam. Additionally, I am having him start on furosemide and escitalopram. Lastly, I am having him maintain his amLODipine-valsartan and carvedilol.  Meds ordered this encounter  Medications  . amLODipine-valsartan (EXFORGE) 10-320 MG tablet    Sig: TAKE  1 TABLET BY MOUTH EVERY DAY    Dispense:  90 tablet    Refill:  1  . carvedilol (COREG) 12.5 MG tablet    Sig: Take 1 tablet (12.5 mg total) by mouth 2 (two) times daily with a meal.    Dispense:  180 tablet    Refill:  1  . DISCONTD: hydrochlorothiazide (HYDRODIURIL) 25 MG tablet    Sig: Take 1 tablet (25 mg total) by mouth daily.    Dispense:  90 tablet    Refill:  3  . ALPRAZolam (XANAX) 0.5 MG tablet    Sig: Take 0.5-1 tablets (0.25-0.5 mg total) by mouth 2 (two) times daily as needed. for anxiety    Dispense:  60 tablet    Refill:  0  . furosemide (LASIX) 40 MG tablet    Sig: Take 1 tablet (40 mg total) by mouth daily.    Dispense:  30 tablet    Refill:  3  . escitalopram (LEXAPRO) 20 MG tablet    Sig: Take 1 tablet (20 mg total) by mouth daily.    Dispense:  30 tablet    Refill:  2    Problem List Items Addressed This Visit      Unprioritized   Anxiety    Stable with prn xanax Refill meds rto 6 months      Relevant Medications   ALPRAZolam (XANAX) 0.5 MG tablet   escitalopram (LEXAPRO) 20 MG tablet   Essential hypertension    Well controlled, no changes to meds. Encouraged heart healthy diet such as the DASH diet and exercise as tolerated.       Relevant Medications   amLODipine-valsartan (EXFORGE) 10-320 MG tablet   carvedilol (COREG) 12.5 MG tablet   furosemide (LASIX) 40 MG tablet   Other Relevant Orders   Lipid panel   CBC with Differential/Platelet   Comprehensive metabolic panel   TSH   Vitamin B12   Vitamin D 1,25 dihydroxy   Testos,Total,Free and SHBG (Male)    Other Visit Diagnoses    Depression, major, single episode, moderate (HCC)    -  Primary   Relevant Medications   ALPRAZolam (XANAX) 0.5 MG tablet   escitalopram (LEXAPRO) 20 MG tablet   Other Relevant Orders   Lipid panel   CBC with Differential/Platelet   Comprehensive metabolic panel   TSH   Vitamin B12   Vitamin D 1,25 dihydroxy   Testos,Total,Free and SHBG (Male)   HTN  (hypertension)       Relevant Medications   amLODipine-valsartan (EXFORGE) 10-320 MG tablet   carvedilol (COREG) 12.5 MG tablet   furosemide (LASIX) 40 MG tablet   Other Relevant Orders   Lipid panel   CBC with Differential/Platelet   Comprehensive metabolic panel   TSH   Vitamin B12   Vitamin D 1,25 dihydroxy   Testos,Total,Free and SHBG (Male)   Lower extremity edema       Relevant Medications   furosemide (LASIX) 40 MG tablet      Follow-up: No follow-ups on file.  Myrene Buddy  Carrizales, DO

## 2017-12-24 NOTE — Assessment & Plan Note (Signed)
Well controlled, no changes to meds. Encouraged heart healthy diet such as the DASH diet and exercise as tolerated.  °

## 2017-12-24 NOTE — Patient Instructions (Signed)
DASH Eating Plan DASH stands for "Dietary Approaches to Stop Hypertension." The DASH eating plan is a healthy eating plan that has been shown to reduce high blood pressure (hypertension). It may also reduce your risk for type 2 diabetes, heart disease, and stroke. The DASH eating plan may also help with weight loss. What are tips for following this plan? General guidelines  Avoid eating more than 2,300 mg (milligrams) of salt (sodium) a day. If you have hypertension, you may need to reduce your sodium intake to 1,500 mg a day.  Limit alcohol intake to no more than 1 drink a day for nonpregnant women and 2 drinks a day for men. One drink equals 12 oz of beer, 5 oz of wine, or 1 oz of hard liquor.  Work with your health care provider to maintain a healthy body weight or to lose weight. Ask what an ideal weight is for you.  Get at least 30 minutes of exercise that causes your heart to beat faster (aerobic exercise) most days of the week. Activities may include walking, swimming, or biking.  Work with your health care provider or diet and nutrition specialist (dietitian) to adjust your eating plan to your individual calorie needs. Reading food labels  Check food labels for the amount of sodium per serving. Choose foods with less than 5 percent of the Daily Value of sodium. Generally, foods with less than 300 mg of sodium per serving fit into this eating plan.  To find whole grains, look for the word "whole" as the first word in the ingredient list. Shopping  Buy products labeled as "low-sodium" or "no salt added."  Buy fresh foods. Avoid canned foods and premade or frozen meals. Cooking  Avoid adding salt when cooking. Use salt-free seasonings or herbs instead of table salt or sea salt. Check with your health care provider or pharmacist before using salt substitutes.  Do not fry foods. Cook foods using healthy methods such as baking, boiling, grilling, and broiling instead.  Cook with  heart-healthy oils, such as olive, canola, soybean, or sunflower oil. Meal planning   Eat a balanced diet that includes: ? 5 or more servings of fruits and vegetables each day. At each meal, try to fill half of your plate with fruits and vegetables. ? Up to 6-8 servings of whole grains each day. ? Less than 6 oz of lean meat, poultry, or fish each day. A 3-oz serving of meat is about the same size as a deck of cards. One egg equals 1 oz. ? 2 servings of low-fat dairy each day. ? A serving of nuts, seeds, or beans 5 times each week. ? Heart-healthy fats. Healthy fats called Omega-3 fatty acids are found in foods such as flaxseeds and coldwater fish, like sardines, salmon, and mackerel.  Limit how much you eat of the following: ? Canned or prepackaged foods. ? Food that is high in trans fat, such as fried foods. ? Food that is high in saturated fat, such as fatty meat. ? Sweets, desserts, sugary drinks, and other foods with added sugar. ? Full-fat dairy products.  Do not salt foods before eating.  Try to eat at least 2 vegetarian meals each week.  Eat more home-cooked food and less restaurant, buffet, and fast food.  When eating at a restaurant, ask that your food be prepared with less salt or no salt, if possible. What foods are recommended? The items listed may not be a complete list. Talk with your dietitian about what   dietary choices are best for you. Grains Whole-grain or whole-wheat bread. Whole-grain or whole-wheat pasta. Brown rice. Oatmeal. Quinoa. Bulgur. Whole-grain and low-sodium cereals. Pita bread. Low-fat, low-sodium crackers. Whole-wheat flour tortillas. Vegetables Fresh or frozen vegetables (raw, steamed, roasted, or grilled). Low-sodium or reduced-sodium tomato and vegetable juice. Low-sodium or reduced-sodium tomato sauce and tomato paste. Low-sodium or reduced-sodium canned vegetables. Fruits All fresh, dried, or frozen fruit. Canned fruit in natural juice (without  added sugar). Meat and other protein foods Skinless chicken or turkey. Ground chicken or turkey. Pork with fat trimmed off. Fish and seafood. Egg whites. Dried beans, peas, or lentils. Unsalted nuts, nut butters, and seeds. Unsalted canned beans. Lean cuts of beef with fat trimmed off. Low-sodium, lean deli meat. Dairy Low-fat (1%) or fat-free (skim) milk. Fat-free, low-fat, or reduced-fat cheeses. Nonfat, low-sodium ricotta or cottage cheese. Low-fat or nonfat yogurt. Low-fat, low-sodium cheese. Fats and oils Soft margarine without trans fats. Vegetable oil. Low-fat, reduced-fat, or light mayonnaise and salad dressings (reduced-sodium). Canola, safflower, olive, soybean, and sunflower oils. Avocado. Seasoning and other foods Herbs. Spices. Seasoning mixes without salt. Unsalted popcorn and pretzels. Fat-free sweets. What foods are not recommended? The items listed may not be a complete list. Talk with your dietitian about what dietary choices are best for you. Grains Baked goods made with fat, such as croissants, muffins, or some breads. Dry pasta or rice meal packs. Vegetables Creamed or fried vegetables. Vegetables in a cheese sauce. Regular canned vegetables (not low-sodium or reduced-sodium). Regular canned tomato sauce and paste (not low-sodium or reduced-sodium). Regular tomato and vegetable juice (not low-sodium or reduced-sodium). Pickles. Olives. Fruits Canned fruit in a light or heavy syrup. Fried fruit. Fruit in cream or butter sauce. Meat and other protein foods Fatty cuts of meat. Ribs. Fried meat. Bacon. Sausage. Bologna and other processed lunch meats. Salami. Fatback. Hotdogs. Bratwurst. Salted nuts and seeds. Canned beans with added salt. Canned or smoked fish. Whole eggs or egg yolks. Chicken or turkey with skin. Dairy Whole or 2% milk, cream, and half-and-half. Whole or full-fat cream cheese. Whole-fat or sweetened yogurt. Full-fat cheese. Nondairy creamers. Whipped toppings.  Processed cheese and cheese spreads. Fats and oils Butter. Stick margarine. Lard. Shortening. Ghee. Bacon fat. Tropical oils, such as coconut, palm kernel, or palm oil. Seasoning and other foods Salted popcorn and pretzels. Onion salt, garlic salt, seasoned salt, table salt, and sea salt. Worcestershire sauce. Tartar sauce. Barbecue sauce. Teriyaki sauce. Soy sauce, including reduced-sodium. Steak sauce. Canned and packaged gravies. Fish sauce. Oyster sauce. Cocktail sauce. Horseradish that you find on the shelf. Ketchup. Mustard. Meat flavorings and tenderizers. Bouillon cubes. Hot sauce and Tabasco sauce. Premade or packaged marinades. Premade or packaged taco seasonings. Relishes. Regular salad dressings. Where to find more information:  National Heart, Lung, and Blood Institute: www.nhlbi.nih.gov  American Heart Association: www.heart.org Summary  The DASH eating plan is a healthy eating plan that has been shown to reduce high blood pressure (hypertension). It may also reduce your risk for type 2 diabetes, heart disease, and stroke.  With the DASH eating plan, you should limit salt (sodium) intake to 2,300 mg a day. If you have hypertension, you may need to reduce your sodium intake to 1,500 mg a day.  When on the DASH eating plan, aim to eat more fresh fruits and vegetables, whole grains, lean proteins, low-fat dairy, and heart-healthy fats.  Work with your health care provider or diet and nutrition specialist (dietitian) to adjust your eating plan to your individual   calorie needs. This information is not intended to replace advice given to you by your health care provider. Make sure you discuss any questions you have with your health care provider. Document Released: 05/01/2011 Document Revised: 05/05/2016 Document Reviewed: 05/05/2016 Elsevier Interactive Patient Education  2018 Elsevier Inc.  

## 2017-12-27 LAB — VITAMIN D 1,25 DIHYDROXY
VITAMIN D 1, 25 (OH) TOTAL: 15 pg/mL — AB (ref 18–72)
Vitamin D2 1, 25 (OH)2: <8 pg/mL
Vitamin D3 1, 25 (OH)2: 15 pg/mL

## 2017-12-27 LAB — TESTOS,TOTAL,FREE AND SHBG (FEMALE)
Free Testosterone: 56 pg/mL (ref 35.0–155.0)
Sex Hormone Binding: 34 nmol/L (ref 10–50)
TESTOSTERONE, TOTAL, LC-MS-MS: 357 ng/dL (ref 250–1100)

## 2017-12-28 ENCOUNTER — Telehealth: Payer: Self-pay

## 2017-12-28 DIAGNOSIS — I10 Essential (primary) hypertension: Secondary | ICD-10-CM

## 2017-12-28 DIAGNOSIS — E559 Vitamin D deficiency, unspecified: Secondary | ICD-10-CM

## 2017-12-28 DIAGNOSIS — K769 Liver disease, unspecified: Secondary | ICD-10-CM

## 2017-12-28 DIAGNOSIS — E7800 Pure hypercholesterolemia, unspecified: Secondary | ICD-10-CM

## 2017-12-28 NOTE — Telephone Encounter (Signed)
-----   Message from Donato SchultzYvonne R Lowne Chase, DO sent at 12/27/2017  2:20 PM EDT ----- Cholesterol--- LDL goal < 100,  HDL >40,  TG < 150.  Diet and exercise will increase HDL and decrease LDL and TG.  Fish,  Fish Oil, Flaxseed oil will also help increase the HDL and decrease Triglycerides.   Recheck labs in 3 months---- liver function also elevated If no improvement -- may need referral to lipid clinic due to elevated liver function Vita D is low--- take otc vita D3 2000u daily Testosterone is normal

## 2017-12-28 NOTE — Telephone Encounter (Signed)
Pt. Returned phone call and Thereasa Parkinauthor reviewed lab results. Pt. Declined lipid clinic referral at this time stating, "it's been a bad few months with drinking. I'd like to cut back and do better with my diet before I go to the lipid clinic". Lab appointment set-up 3 months from now, future orders pending.

## 2017-12-28 NOTE — Telephone Encounter (Signed)
Author phoned pt. to relay lab results, no answer. Author left generic VM to call back, #[phone].

## 2018-02-01 ENCOUNTER — Other Ambulatory Visit: Payer: Self-pay | Admitting: Family Medicine

## 2018-02-01 DIAGNOSIS — F419 Anxiety disorder, unspecified: Secondary | ICD-10-CM

## 2018-02-02 NOTE — Telephone Encounter (Signed)
Database ran and is on your desk for review.  Last filled per database: 12/24/17 Last written: 12/24/17 Last ov: 12/24/17 Next ov: 07/05/18 Contract: need UDS: need  Can we get at 07/05/18 appt since we missed at 12/24/17 appt.

## 2018-03-08 ENCOUNTER — Other Ambulatory Visit: Payer: Self-pay | Admitting: Family Medicine

## 2018-03-08 DIAGNOSIS — F419 Anxiety disorder, unspecified: Secondary | ICD-10-CM

## 2018-03-08 NOTE — Telephone Encounter (Signed)
Last refill on 02/02/2018  #60 no refills Last office visit on 12/24/2017

## 2018-03-22 ENCOUNTER — Other Ambulatory Visit: Payer: Self-pay | Admitting: Family Medicine

## 2018-03-22 DIAGNOSIS — F321 Major depressive disorder, single episode, moderate: Secondary | ICD-10-CM

## 2018-03-22 DIAGNOSIS — F419 Anxiety disorder, unspecified: Secondary | ICD-10-CM

## 2018-03-30 ENCOUNTER — Other Ambulatory Visit (INDEPENDENT_AMBULATORY_CARE_PROVIDER_SITE_OTHER): Payer: BLUE CROSS/BLUE SHIELD

## 2018-03-30 DIAGNOSIS — E7800 Pure hypercholesterolemia, unspecified: Secondary | ICD-10-CM

## 2018-03-30 DIAGNOSIS — K769 Liver disease, unspecified: Secondary | ICD-10-CM

## 2018-03-30 DIAGNOSIS — I10 Essential (primary) hypertension: Secondary | ICD-10-CM

## 2018-03-30 DIAGNOSIS — E559 Vitamin D deficiency, unspecified: Secondary | ICD-10-CM

## 2018-03-30 LAB — COMPREHENSIVE METABOLIC PANEL
ALBUMIN: 4.7 g/dL (ref 3.5–5.2)
ALT: 31 U/L (ref 0–53)
AST: 22 U/L (ref 0–37)
Alkaline Phosphatase: 52 U/L (ref 39–117)
BUN: 13 mg/dL (ref 6–23)
CHLORIDE: 96 meq/L (ref 96–112)
CO2: 32 mEq/L (ref 19–32)
CREATININE: 0.92 mg/dL (ref 0.40–1.50)
Calcium: 9.8 mg/dL (ref 8.4–10.5)
GFR: 91.05 mL/min (ref >60.00)
GLUCOSE: 109 mg/dL — AB (ref 70–99)
POTASSIUM: 4.2 meq/L (ref 3.5–5.1)
SODIUM: 136 meq/L (ref 135–145)
Total Bilirubin: 0.7 mg/dL (ref 0.2–1.2)
Total Protein: 7.1 g/dL (ref 6.0–8.3)

## 2018-03-30 LAB — CBC WITH DIFFERENTIAL/PLATELET
BASOS PCT: 0.5 % (ref 0.0–3.0)
Basophils Absolute: 0 10*3/uL (ref 0.0–0.1)
EOS PCT: 4.3 % (ref 0.0–5.0)
Eosinophils Absolute: 0.2 10*3/uL (ref 0.0–0.7)
HEMATOCRIT: 44.9 % (ref 39.0–52.0)
HEMOGLOBIN: 15.3 g/dL (ref 13.0–17.0)
LYMPHS ABS: 3 10*3/uL (ref 0.7–4.0)
Lymphocytes Relative: 52.6 % — ABNORMAL HIGH (ref 12.0–46.0)
MCHC: 34.1 g/dL (ref 30.0–36.0)
MCV: 90.5 fl (ref 78.0–100.0)
MONO ABS: 0.5 10*3/uL (ref 0.1–1.0)
Monocytes Relative: 9.2 % (ref 3.0–12.0)
Neutro Abs: 1.9 10*3/uL (ref 1.4–7.7)
Neutrophils Relative %: 33.4 % — ABNORMAL LOW (ref 43.0–77.0)
Platelets: 226 10*3/uL (ref 150.0–400.0)
RBC: 4.96 Mil/uL (ref 4.22–5.81)
RDW: 13.7 % (ref 11.5–15.5)
WBC: 5.6 10*3/uL (ref 4.0–10.5)

## 2018-03-30 LAB — LIPID PANEL
CHOLESTEROL: 196 mg/dL (ref 0–200)
HDL: 33.5 mg/dL — AB (ref >39.00)
LDL Cholesterol: 137 mg/dL — ABNORMAL HIGH (ref 0–99)
NONHDL: 162.36
Total CHOL/HDL Ratio: 6
Triglycerides: 126 mg/dL (ref 0.0–149.0)
VLDL: 25.2 mg/dL (ref 0.0–40.0)

## 2018-04-02 LAB — VITAMIN D 1,25 DIHYDROXY
VITAMIN D 1, 25 (OH) TOTAL: 41 pg/mL (ref 18–72)
VITAMIN D3 1, 25 (OH): 41 pg/mL
Vitamin D2 1, 25 (OH)2: <8 pg/mL

## 2018-04-06 ENCOUNTER — Telehealth: Payer: Self-pay | Admitting: Family Medicine

## 2018-04-06 NOTE — Telephone Encounter (Signed)
Copied from CRM [id number]. Topic: General - Other >> Apr 06, 2018 11:27 AM Floria Raveling A wrote: Reason for CRM: Pt called in and stated he has not gotten labs from 11/5 and did not see a note ok to give them.  Please advise pt

## 2018-04-07 ENCOUNTER — Other Ambulatory Visit: Payer: Self-pay | Admitting: Family Medicine

## 2018-04-07 DIAGNOSIS — F419 Anxiety disorder, unspecified: Secondary | ICD-10-CM

## 2018-04-07 MED ORDER — ALPRAZOLAM 0.5 MG PO TABS: ORAL_TABLET | ORAL | 0 refills | Status: DC

## 2018-04-07 NOTE — Telephone Encounter (Signed)
Requested medication (s) are due for refill today: Yes  Requested medication (s) are on the active medication list: Yes  Last refill:  03/08/18  Future visit scheduled: Yes  Notes to clinic:  Unable to refill per protocol, narcotic.     Requested Prescriptions  Pending Prescriptions Disp Refills   ALPRAZolam (XANAX) 0.5 MG tablet 60 tablet 0    Sig: TAKE 1/2 TO 1 TABLET(0.25 TO 0.5 MG) BY MOUTH TWICE DAILY AS NEEDED FOR ANXIETY     Not Delegated - Psychiatry:  Anxiolytics/Hypnotics Failed - 04/07/2018 12:05 PM      Failed - This refill cannot be delegated      Failed - Urine Drug Screen completed in last 360 days.      Passed - Valid encounter within last 6 months    Recent Outpatient Visits          3 months ago Depression, major, single episode, moderate (HCC)   [facility] [place] Chase, [place], DO   10 months ago Insomnia, unspecified type   [facility] Low[place] Chase, [place] R, DO      Future Appointments            In 2 months Zola ButtonLowne Chase, [place] CongressYvonne R, DO [facility] [place]

## 2018-04-07 NOTE — Telephone Encounter (Signed)
Left message on machine to call back See result notes  

## 2018-04-19 ENCOUNTER — Other Ambulatory Visit: Payer: Self-pay | Admitting: Family Medicine

## 2018-04-19 DIAGNOSIS — R600 Localized edema: Secondary | ICD-10-CM

## 2018-04-19 DIAGNOSIS — I10 Essential (primary) hypertension: Secondary | ICD-10-CM

## 2018-04-30 ENCOUNTER — Other Ambulatory Visit: Payer: Self-pay | Admitting: Family Medicine

## 2018-04-30 DIAGNOSIS — F419 Anxiety disorder, unspecified: Secondary | ICD-10-CM

## 2018-04-30 NOTE — Telephone Encounter (Signed)
Too early for refills, next refill is 05/07/2018

## 2018-04-30 NOTE — Telephone Encounter (Signed)
Requesting:xanax Contract:no  UDS:no Last OV:12/24/17 Next OV:07/05/17 Last Refill:04/07/18 #60-0rf Database:   Please advise

## 2018-05-03 ENCOUNTER — Other Ambulatory Visit: Payer: Self-pay | Admitting: Family Medicine

## 2018-05-03 DIAGNOSIS — F419 Anxiety disorder, unspecified: Secondary | ICD-10-CM

## 2018-05-03 NOTE — Telephone Encounter (Addendum)
Patient is waiting for this prescription to leave on a business trip.  He would like this called in ASAP to his preferred pharmacy [facility] [address]

## 2018-05-04 NOTE — Telephone Encounter (Signed)
Patient called to request the status of his medication refill.  CB# [phone]

## 2018-05-04 NOTE — Telephone Encounter (Signed)
Pt requesting refill on alprazolam.   Last OV: 12/24/2017 Last Fill: 04/07/2018 #60 and 0RF UDS: None

## 2018-05-16 ENCOUNTER — Other Ambulatory Visit: Payer: Self-pay | Admitting: Family Medicine

## 2018-05-16 DIAGNOSIS — R600 Localized edema: Secondary | ICD-10-CM

## 2018-05-16 DIAGNOSIS — I10 Essential (primary) hypertension: Secondary | ICD-10-CM

## 2018-06-08 ENCOUNTER — Other Ambulatory Visit: Payer: Self-pay | Admitting: Family Medicine

## 2018-06-08 DIAGNOSIS — F419 Anxiety disorder, unspecified: Secondary | ICD-10-CM

## 2018-06-15 NOTE — Telephone Encounter (Signed)
Database ran and is on your desk for review.  Last filled per database: 05/09/18 Last written: 05/04/18 Last ov: 12/24/17 Next ov: 07/05/18 Contract: will get at 2/10 visit UDS: will get at 2/10 visit

## 2018-06-18 ENCOUNTER — Other Ambulatory Visit: Payer: Self-pay | Admitting: Family Medicine

## 2018-06-18 DIAGNOSIS — I10 Essential (primary) hypertension: Secondary | ICD-10-CM

## 2018-06-28 ENCOUNTER — Encounter: Payer: Self-pay | Admitting: Medical

## 2018-06-28 ENCOUNTER — Ambulatory Visit (HOSPITAL_BASED_OUTPATIENT_CLINIC_OR_DEPARTMENT_OTHER)
Admission: RE | Admit: 2018-06-28 | Discharge: 2018-06-28 | Disposition: A | Discharge status: Home / Self Care | Payer: BLUE CROSS/BLUE SHIELD | Source: Ambulatory Visit | Attending: Medical | Admitting: Medical

## 2018-06-28 ENCOUNTER — Ambulatory Visit (INDEPENDENT_AMBULATORY_CARE_PROVIDER_SITE_OTHER): Payer: BLUE CROSS/BLUE SHIELD | Admitting: Medical

## 2018-06-28 VITALS — BP 147/77 | HR 81 | Temp 98.5°F | Ht 70.0 in | Wt 252.0 lb

## 2018-06-28 DIAGNOSIS — M898X1 Other specified disorders of bone, shoulder: Secondary | ICD-10-CM | POA: Diagnosis not present

## 2018-06-28 DIAGNOSIS — M542 Cervicalgia: Secondary | ICD-10-CM

## 2018-06-28 DIAGNOSIS — M549 Dorsalgia, unspecified: Secondary | ICD-10-CM | POA: Diagnosis present

## 2018-06-28 DIAGNOSIS — M25562 Pain in left knee: Secondary | ICD-10-CM

## 2018-06-28 DIAGNOSIS — M25561 Pain in right knee: Secondary | ICD-10-CM | POA: Diagnosis not present

## 2018-06-28 MED ORDER — HYDROCODONE-ACETAMINOPHEN 5-325 MG PO TABS: 1.0000 | ORAL_TABLET | Freq: Four times a day (QID) | ORAL | 0 refills | Status: DC | PRN

## 2018-06-28 MED ORDER — DICLOFENAC SODIUM 75 MG PO TBEC: 75.0000 mg | DELAYED_RELEASE_TABLET | Freq: Two times a day (BID) | ORAL | 0 refills | Status: AC

## 2018-06-28 MED ORDER — TIZANIDINE HCL 6 MG PO CAPS: ORAL_CAPSULE | ORAL | 0 refills | Status: DC
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
## 2018-07-01 ENCOUNTER — Other Ambulatory Visit: Payer: Self-pay | Admitting: Emergency Medicine

## 2018-07-01 DIAGNOSIS — E7800 Pure hypercholesterolemia, unspecified: Secondary | ICD-10-CM

## 2018-07-05 ENCOUNTER — Ambulatory Visit: Payer: BLUE CROSS/BLUE SHIELD | Admitting: Family Medicine

## 2018-07-05 ENCOUNTER — Other Ambulatory Visit: Payer: BLUE CROSS/BLUE SHIELD

## 2018-07-07 ENCOUNTER — Ambulatory Visit: Payer: BLUE CROSS/BLUE SHIELD | Admitting: Family Medicine

## 2018-07-07 ENCOUNTER — Encounter: Payer: Self-pay | Admitting: *Deleted

## 2018-07-07 DIAGNOSIS — Z0289 Encounter for other administrative examinations: Secondary | ICD-10-CM

## 2018-07-08 ENCOUNTER — Other Ambulatory Visit: Payer: BLUE CROSS/BLUE SHIELD

## 2018-09-16 ENCOUNTER — Other Ambulatory Visit: Payer: Self-pay | Admitting: Family Medicine

## 2018-09-16 DIAGNOSIS — I10 Essential (primary) hypertension: Secondary | ICD-10-CM

## 2018-09-23 ENCOUNTER — Other Ambulatory Visit: Payer: Self-pay | Admitting: Family Medicine

## 2018-09-23 DIAGNOSIS — F321 Major depressive disorder, single episode, moderate: Secondary | ICD-10-CM

## 2018-09-23 DIAGNOSIS — F419 Anxiety disorder, unspecified: Secondary | ICD-10-CM

## 2018-09-24 NOTE — Telephone Encounter (Signed)
Requesting: Xanax Contract: N/A UDS: N/A Last OV: 06/28/2018 Next OV: N/A Last Refill: 06/15/2018, #60--1 RF Database:   Please advise

## 2018-11-29 ENCOUNTER — Other Ambulatory Visit: Payer: Self-pay | Admitting: Family Medicine

## 2018-11-29 DIAGNOSIS — I10 Essential (primary) hypertension: Secondary | ICD-10-CM

## 2018-11-29 DIAGNOSIS — R600 Localized edema: Secondary | ICD-10-CM

## 2018-12-20 ENCOUNTER — Other Ambulatory Visit: Payer: Self-pay | Admitting: Family Medicine

## 2018-12-20 DIAGNOSIS — I10 Essential (primary) hypertension: Secondary | ICD-10-CM

## 2018-12-21 ENCOUNTER — Telehealth: Payer: Self-pay | Admitting: *Deleted

## 2018-12-21 ENCOUNTER — Other Ambulatory Visit: Payer: Self-pay | Admitting: Family Medicine

## 2018-12-21 ENCOUNTER — Other Ambulatory Visit: Payer: Self-pay | Admitting: *Deleted

## 2018-12-21 DIAGNOSIS — F419 Anxiety disorder, unspecified: Secondary | ICD-10-CM

## 2018-12-21 DIAGNOSIS — F321 Major depressive disorder, single episode, moderate: Secondary | ICD-10-CM

## 2018-12-21 DIAGNOSIS — I10 Essential (primary) hypertension: Secondary | ICD-10-CM

## 2018-12-21 MED ORDER — ALPRAZOLAM 0.5 MG PO TABS: ORAL_TABLET | ORAL | 1 refills | Status: DC

## 2018-12-21 MED ORDER — AMLODIPINE BESYLATE-VALSARTAN 10-320 MG PO TABS: 1.0000 | ORAL_TABLET | Freq: Every day | ORAL | 0 refills | Status: DC

## 2018-12-21 MED ORDER — ESCITALOPRAM OXALATE 20 MG PO TABS: ORAL_TABLET | ORAL | 0 refills | Status: DC

## 2018-12-21 NOTE — Telephone Encounter (Signed)
[facility] refill for [facility]ax  Last written: 09/24/18 Last ov: 12/24/17 Next ov:  Contract: none  UDS: none

## 2018-12-21 NOTE — Telephone Encounter (Signed)
done

## 2019-03-11 ENCOUNTER — Other Ambulatory Visit: Payer: Self-pay | Admitting: *Deleted

## 2019-03-11 DIAGNOSIS — I10 Essential (primary) hypertension: Secondary | ICD-10-CM

## 2019-03-11 DIAGNOSIS — R600 Localized edema: Secondary | ICD-10-CM

## 2019-03-11 MED ORDER — FUROSEMIDE 40 MG PO TABS: ORAL_TABLET | ORAL | 0 refills | Status: DC

## 2019-03-21 ENCOUNTER — Other Ambulatory Visit: Payer: Self-pay | Admitting: *Deleted

## 2019-03-21 DIAGNOSIS — I10 Essential (primary) hypertension: Secondary | ICD-10-CM

## 2019-03-21 MED ORDER — HYDROCHLOROTHIAZIDE 25 MG PO TABS: ORAL_TABLET | ORAL | 0 refills | Status: DC

## 2019-03-21 MED ORDER — AMLODIPINE BESYLATE-VALSARTAN 10-320 MG PO TABS: 1.0000 | ORAL_TABLET | Freq: Every day | ORAL | 0 refills | Status: DC

## 2019-03-21 NOTE — Telephone Encounter (Signed)
Left message on machine to call back to schedule follow up appointment.  Patient is past due.  rxs sent in for 15 day supply.

## 2019-04-11 ENCOUNTER — Ambulatory Visit (INDEPENDENT_AMBULATORY_CARE_PROVIDER_SITE_OTHER): Payer: Self-pay | Admitting: Family Medicine

## 2019-04-11 ENCOUNTER — Other Ambulatory Visit: Payer: Self-pay

## 2019-04-11 ENCOUNTER — Encounter: Payer: Self-pay | Admitting: Family Medicine

## 2019-04-11 DIAGNOSIS — F321 Major depressive disorder, single episode, moderate: Secondary | ICD-10-CM

## 2019-04-11 DIAGNOSIS — I10 Essential (primary) hypertension: Secondary | ICD-10-CM

## 2019-04-11 DIAGNOSIS — F419 Anxiety disorder, unspecified: Secondary | ICD-10-CM

## 2019-04-11 DIAGNOSIS — R600 Localized edema: Secondary | ICD-10-CM

## 2019-04-11 MED ORDER — AMLODIPINE BESYLATE-VALSARTAN 10-320 MG PO TABS: 1.0000 | ORAL_TABLET | Freq: Every day | ORAL | 1 refills | Status: DC

## 2019-04-11 MED ORDER — FUROSEMIDE 40 MG PO TABS: ORAL_TABLET | ORAL | 2 refills | Status: DC

## 2019-04-11 MED ORDER — ALPRAZOLAM 0.5 MG PO TABS: ORAL_TABLET | ORAL | 1 refills | Status: DC

## 2019-04-11 MED ORDER — ESCITALOPRAM OXALATE 20 MG PO TABS: ORAL_TABLET | ORAL | 3 refills | Status: DC
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
## 2019-07-29 ENCOUNTER — Telehealth: Payer: Self-pay | Admitting: *Deleted

## 2019-07-29 NOTE — Telephone Encounter (Signed)
[facility] [address] requesting refill for alprazolam.  Last written: 04/11/19 Last ov: 04/11/19 Next ov: none Contract: none UDS: none

## 2019-08-01 ENCOUNTER — Other Ambulatory Visit: Payer: Self-pay | Admitting: Family Medicine

## 2019-08-01 DIAGNOSIS — F419 Anxiety disorder, unspecified: Secondary | ICD-10-CM

## 2019-08-01 MED ORDER — ALPRAZOLAM 0.5 MG PO TABS: ORAL_TABLET | ORAL | 1 refills | Status: DC

## 2019-08-01 NOTE — Telephone Encounter (Signed)
Refilled

## 2019-08-19 ENCOUNTER — Ambulatory Visit: Payer: Self-pay | Attending: Internal Medicine

## 2019-08-19 DIAGNOSIS — Z23 Encounter for immunization: Secondary | ICD-10-CM

## 2019-08-19 NOTE — Progress Notes (Signed)
   Covid-19 Vaccination Clinic  Name:  Brad Ellis    MRN: [id number] DOB: 18-Dec-1965  08/19/2019  [name] was observed post Covid-19 immunization for 15 minutes without incident. He was provided with Vaccine Information Sheet and instruction to access the V-Safe system.   [name] was instructed to call 911 with any severe reactions post vaccine: Marland Kitchen Difficulty breathing  . Swelling of face and throat  . A fast heartbeat  . A bad rash all over body  . Dizziness and weakness   Immunizations Administered    Name Date Dose VIS Date Route   Pfizer COVID-19 Vaccine 08/19/2019 11:24 AM 0.3 mL 05/06/2019 Intramuscular   Manufacturer: [employer], [employer]   Lot: [id number]   NDC: [address]-9

## 2019-09-12 ENCOUNTER — Ambulatory Visit: Payer: Self-pay | Attending: Internal Medicine

## 2019-09-12 DIAGNOSIS — Z23 Encounter for immunization: Secondary | ICD-10-CM

## 2019-09-12 NOTE — Progress Notes (Signed)
   Covid-19 Vaccination Clinic  Name:  DAYVON DAX    MRN: [id number] DOB: 11-02-1965  09/12/2019  Mr. Plotkin was observed post Covid-19 immunization for 15 minutes without incident. He was provided with Vaccine Information Sheet and instruction to access the V-Safe system.   Mr. Hageman was instructed to call 911 with any severe reactions post vaccine: Marland Kitchen Difficulty breathing  . Swelling of face and throat  . A fast heartbeat  . A bad rash all over body  . Dizziness and weakness   Immunizations Administered    Name Date Dose VIS Date Route   Pfizer COVID-19 Vaccine 09/12/2019  3:54 PM 0.3 mL 07/20/2018 Intramuscular   Manufacturer: [employer], [employer]   Lot: [id number]   NDC: [address]-7
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
## 2019-10-05 ENCOUNTER — Other Ambulatory Visit: Payer: Self-pay | Admitting: *Deleted

## 2019-10-05 DIAGNOSIS — I10 Essential (primary) hypertension: Secondary | ICD-10-CM

## 2019-10-05 MED ORDER — AMLODIPINE BESYLATE-VALSARTAN 10-320 MG PO TABS: 1.0000 | ORAL_TABLET | Freq: Every day | ORAL | 0 refills | Status: DC

## 2019-12-15 ENCOUNTER — Other Ambulatory Visit: Payer: Self-pay | Admitting: Family Medicine

## 2019-12-15 DIAGNOSIS — I10 Essential (primary) hypertension: Secondary | ICD-10-CM

## 2019-12-16 ENCOUNTER — Other Ambulatory Visit: Payer: Self-pay

## 2019-12-16 DIAGNOSIS — I10 Essential (primary) hypertension: Secondary | ICD-10-CM

## 2019-12-16 MED ORDER — CARVEDILOL 12.5 MG PO TABS: ORAL_TABLET | ORAL | 0 refills | Status: DC

## 2019-12-18 ENCOUNTER — Other Ambulatory Visit: Payer: Self-pay | Admitting: Family Medicine

## 2019-12-18 DIAGNOSIS — F419 Anxiety disorder, unspecified: Secondary | ICD-10-CM

## 2019-12-19 NOTE — Telephone Encounter (Signed)
Last office visit- 04-11-2019 Last refill- 08-01-2019 Next office visit-Not scheduled

## 2020-01-02 ENCOUNTER — Other Ambulatory Visit: Payer: Self-pay | Admitting: Family Medicine

## 2020-01-02 DIAGNOSIS — I10 Essential (primary) hypertension: Secondary | ICD-10-CM

## 2020-01-09 ENCOUNTER — Other Ambulatory Visit: Payer: Self-pay | Admitting: Family Medicine

## 2020-01-09 DIAGNOSIS — I10 Essential (primary) hypertension: Secondary | ICD-10-CM

## 2020-01-09 DIAGNOSIS — R600 Localized edema: Secondary | ICD-10-CM

## 2020-01-09 NOTE — Telephone Encounter (Signed)
Patient schedule appt foir 01/23/20  Medication: amLODipine-valsartan (EXFORGE) 10-320 MG tablet [[id number]]   furosemide (LASIX) 40 MG tablet [[id number]]     Has the patient contacted their pharmacy?  (If no, request that the patient contact the pharmacy for the refill.) (If yes, when and what did the pharmacy advise?)     Preferred Pharmacy (with phone number or street name):  [facility] #[id number] Ginette Otto, [place] - [address] AT [facility] OF [facility] & [address]ET  [address] [place], [place] [address]  Phone:  [phone] Fax:  [phone]     Agent: Please be advised that RX refills may take up to 3 business days. We ask that you follow-up with your pharmacy.

## 2020-01-10 NOTE — Telephone Encounter (Signed)
Refills sent 01/09/20.

## 2020-01-23 ENCOUNTER — Ambulatory Visit: Payer: Self-pay | Admitting: Family Medicine

## 2020-04-08 ENCOUNTER — Other Ambulatory Visit: Payer: Self-pay | Admitting: Family Medicine

## 2020-04-08 DIAGNOSIS — F321 Major depressive disorder, single episode, moderate: Secondary | ICD-10-CM

## 2020-04-08 DIAGNOSIS — F419 Anxiety disorder, unspecified: Secondary | ICD-10-CM

## 2020-04-09 ENCOUNTER — Other Ambulatory Visit: Payer: Self-pay | Admitting: Family Medicine

## 2020-04-09 DIAGNOSIS — I10 Essential (primary) hypertension: Secondary | ICD-10-CM

## 2020-04-09 DIAGNOSIS — R600 Localized edema: Secondary | ICD-10-CM

## 2020-04-13 ENCOUNTER — Ambulatory Visit: Payer: Self-pay | Admitting: Family Medicine

## 2020-04-16 ENCOUNTER — Encounter: Payer: Self-pay | Admitting: Family Medicine

## 2020-04-16 ENCOUNTER — Telehealth (INDEPENDENT_AMBULATORY_CARE_PROVIDER_SITE_OTHER): Payer: Self-pay | Admitting: Family Medicine

## 2020-04-16 ENCOUNTER — Other Ambulatory Visit: Payer: Self-pay

## 2020-04-16 VITALS — BP 128/80

## 2020-04-16 DIAGNOSIS — F419 Anxiety disorder, unspecified: Secondary | ICD-10-CM

## 2020-04-16 DIAGNOSIS — I10 Essential (primary) hypertension: Secondary | ICD-10-CM

## 2020-04-16 NOTE — Progress Notes (Signed)
Virtual Visit via Video Note  I connected with Brad Ellis on 04/16/20 at  2:20 PM EST by a video enabled telemedicine application and verified that I am speaking with the correct person using two identifiers.  Location: Patient: in car with friend in back seat  Provider: office    I discussed the limitations of evaluation and management by telemedicine and the availability of in person appointments. The patient expressed understanding and agreed to proceed.  History of Present Illness: Pt is in his car f/u bp and anxiety.  He has no complaints and needs no refills at this time    Observations/Objective: Vitals:   04/16/20 1424  BP: 128/80  pt is in nad   Assessment and Plan: 1. Primary hypertension Well controlled, no changes to meds. Encouraged heart healthy diet such as the DASH diet and exercise as tolerated.   - Lipid panel; Future - Comprehensive metabolic panel; Future  2. Anxiety Stable con't prn med  Follow Up Instructions:    I discussed the assessment and treatment plan with the patient. The patient was provided an opportunity to ask questions and all were answered. The patient agreed with the plan and demonstrated an understanding of the instructions.   The patient was advised to call back or seek an in-person evaluation if the symptoms worsen or if the condition fails to improve as anticipated.  I provided 25 minutes of non-face-to-face time during this encounter.   Donato Schultz, DO

## 2020-07-12 ENCOUNTER — Telehealth: Payer: Self-pay | Admitting: Family Medicine

## 2020-07-12 ENCOUNTER — Other Ambulatory Visit: Payer: Self-pay | Admitting: Family Medicine

## 2020-07-12 DIAGNOSIS — F419 Anxiety disorder, unspecified: Secondary | ICD-10-CM

## 2020-07-12 DIAGNOSIS — R600 Localized edema: Secondary | ICD-10-CM

## 2020-07-12 DIAGNOSIS — F321 Major depressive disorder, single episode, moderate: Secondary | ICD-10-CM

## 2020-07-12 DIAGNOSIS — I10 Essential (primary) hypertension: Secondary | ICD-10-CM

## 2020-07-12 MED ORDER — AMLODIPINE BESYLATE-VALSARTAN 10-320 MG PO TABS: 1.0000 | ORAL_TABLET | Freq: Every day | ORAL | 0 refills | Status: DC

## 2020-07-12 NOTE — Telephone Encounter (Signed)
Refill sent.

## 2020-07-12 NOTE — Telephone Encounter (Signed)
Medication:  amLODipine-valsartan (EXFORGE) 10-320 MG tablet [[id number]]      Has the patient contacted their pharmacy?  (If no, request that the patient contact the pharmacy for the refill.) (If yes, when and what did the pharmacy advise?)     Preferred Pharmacy (with phone number or street name):  [facility] #[id number] Ginette Otto, [place] - [address] AT [facility] OF [facility] & [address]ET  [address] [place], [place] [address]  Phone:  [phone] Fax:  [phone]     Agent: Please be advised that RX refills may take up to 3 business days. We ask that you follow-up with your pharmacy.

## 2020-07-12 NOTE — Telephone Encounter (Signed)
Requesting: alprazolam 0.5mg  Contract: 07/07/2018 UDS: None Last Visit: 04/16/2020 Next Visit: None Last Refill:  12/19/2019 #30 and 1RF Pt sig: 1 tab tid prn  Please Advise

## 2020-07-16 ENCOUNTER — Other Ambulatory Visit: Payer: BLUE CROSS/BLUE SHIELD

## 2020-07-16 ENCOUNTER — Telehealth: Payer: Self-pay | Admitting: *Deleted

## 2020-07-16 NOTE — Telephone Encounter (Signed)
Caller Name Pinkney Venard Caller Phone Number [phone] Patient Name Brad Ellis Patient DOB 1966/03/08 Call Type Message Only Information Provided Reason for Call Request to Schedule Office Appointment Initial Comment Caller states he needs to reschedule tomorrows appt if possible for Friday. Additional Comment hours provide

## 2020-07-16 NOTE — Telephone Encounter (Signed)
Patient scheduled.

## 2020-07-16 NOTE — Telephone Encounter (Signed)
Lab appt cancelled.  Tried to call and reschedule but no answer/vm full.

## 2020-07-23 ENCOUNTER — Other Ambulatory Visit: Payer: Self-pay

## 2020-07-23 ENCOUNTER — Other Ambulatory Visit (INDEPENDENT_AMBULATORY_CARE_PROVIDER_SITE_OTHER): Payer: BLUE CROSS/BLUE SHIELD

## 2020-07-23 DIAGNOSIS — I10 Essential (primary) hypertension: Secondary | ICD-10-CM

## 2020-07-23 LAB — COMPREHENSIVE METABOLIC PANEL
ALT: 156 U/L — ABNORMAL HIGH (ref 0–53)
AST: 95 U/L — ABNORMAL HIGH (ref 0–37)
Albumin: 4.7 g/dL (ref 3.5–5.2)
Alkaline Phosphatase: 47 U/L (ref 39–117)
BUN: 15 mg/dL (ref 6–23)
CO2: 31 mEq/L (ref 19–32)
Calcium: 10.1 mg/dL (ref 8.4–10.5)
Chloride: 95 mEq/L — ABNORMAL LOW (ref 96–112)
Creatinine, Ser: 0.99 mg/dL (ref 0.40–1.50)
GFR: 85.15 mL/min (ref >60.00)
Glucose, Bld: 108 mg/dL — ABNORMAL HIGH (ref 70–99)
Potassium: 4.1 mEq/L (ref 3.5–5.1)
Sodium: 136 mEq/L (ref 135–145)
Total Bilirubin: 1.1 mg/dL (ref 0.2–1.2)
Total Protein: 7.4 g/dL (ref 6.0–8.3)

## 2020-07-23 LAB — LIPID PANEL
Cholesterol: 230 mg/dL — ABNORMAL HIGH (ref 0–200)
HDL: 40.3 mg/dL (ref >39.00)
LDL Cholesterol: 163 mg/dL — ABNORMAL HIGH (ref 0–99)
NonHDL: 190.05
Total CHOL/HDL Ratio: 6
Triglycerides: 135 mg/dL (ref 0.0–149.0)
VLDL: 27 mg/dL (ref 0.0–40.0)

## 2020-07-25 ENCOUNTER — Other Ambulatory Visit: Payer: Self-pay | Admitting: Family Medicine

## 2020-07-25 DIAGNOSIS — R748 Abnormal levels of other serum enzymes: Secondary | ICD-10-CM

## 2020-10-14 ENCOUNTER — Other Ambulatory Visit: Payer: Self-pay | Admitting: Family Medicine

## 2020-10-14 DIAGNOSIS — I10 Essential (primary) hypertension: Secondary | ICD-10-CM

## 2020-10-15 ENCOUNTER — Telehealth: Payer: Self-pay | Admitting: Family Medicine

## 2020-10-15 DIAGNOSIS — I10 Essential (primary) hypertension: Secondary | ICD-10-CM

## 2020-10-15 MED ORDER — AMLODIPINE BESYLATE-VALSARTAN 10-320 MG PO TABS: 1.0000 | ORAL_TABLET | Freq: Every day | ORAL | 0 refills | Status: DC

## 2020-10-15 NOTE — Telephone Encounter (Signed)
Medication: amLODipine-valsartan (EXFORGE) 10-320 MG tablet [[id number]]      Has the patient contacted their pharmacy? no (If no, request that the patient contact the pharmacy for the refill.) (If yes, when and what did the pharmacy advise?)    Preferred Pharmacy (with phone number or street name)  [facility] #[id number] Ginette Otto, [place] - [address] AT [facility] OF [facility] & MARKET  [address] [place], [place] [address]  Phone:  [phone] Fax:  [phone]      Agent: Please be advised that RX refills may take up to 3 business days. We ask that you follow-up with your pharmacy.

## 2020-10-15 NOTE — Telephone Encounter (Signed)
Refill sent.

## 2020-10-16 ENCOUNTER — Other Ambulatory Visit: Payer: Self-pay

## 2020-10-16 DIAGNOSIS — I10 Essential (primary) hypertension: Secondary | ICD-10-CM

## 2020-10-16 MED ORDER — AMLODIPINE BESYLATE-VALSARTAN 10-320 MG PO TABS: 1.0000 | ORAL_TABLET | Freq: Every day | ORAL | 0 refills | Status: DC

## 2020-11-27 ENCOUNTER — Other Ambulatory Visit: Payer: Self-pay | Admitting: Family Medicine

## 2020-11-27 DIAGNOSIS — F419 Anxiety disorder, unspecified: Secondary | ICD-10-CM

## 2020-11-27 NOTE — Telephone Encounter (Signed)
Requesting: Xanax Contract: N/A UDS: N/A Last OV: 04/16/20 Next OV: N/A Last Refill: 07/13/20, #60--1 RF Database:   Please advise

## 2021-01-04 ENCOUNTER — Telehealth: Payer: Self-pay | Admitting: Family Medicine

## 2021-01-04 DIAGNOSIS — R600 Localized edema: Secondary | ICD-10-CM

## 2021-01-04 DIAGNOSIS — I10 Essential (primary) hypertension: Secondary | ICD-10-CM

## 2021-01-04 MED ORDER — FUROSEMIDE 40 MG PO TABS: 40.0000 mg | ORAL_TABLET | Freq: Every day | ORAL | 0 refills | Status: DC

## 2021-01-04 NOTE — Telephone Encounter (Signed)
Refills sent

## 2021-01-04 NOTE — Telephone Encounter (Signed)
Medication: furosemide (LASIX) 40 MG tablet [[id number]]     Has the patient contacted their pharmacy? Yes.   (If no, request that the patient contact the pharmacy for the refill.) (If yes, when and what did the pharmacy advise?)    Preferred Pharmacy (with phone number or street name): [facility], [address]    Agent: Please be advised that RX refills may take up to 3 business days. We ask that you follow-up with your pharmacy.

## 2021-02-02 ENCOUNTER — Other Ambulatory Visit: Payer: Self-pay | Admitting: Family Medicine

## 2021-02-02 DIAGNOSIS — I10 Essential (primary) hypertension: Secondary | ICD-10-CM

## 2021-02-02 DIAGNOSIS — R600 Localized edema: Secondary | ICD-10-CM

## 2021-02-02 DIAGNOSIS — F321 Major depressive disorder, single episode, moderate: Secondary | ICD-10-CM

## 2021-02-02 DIAGNOSIS — F419 Anxiety disorder, unspecified: Secondary | ICD-10-CM

## 2021-02-04 ENCOUNTER — Telehealth: Payer: Self-pay | Admitting: Family Medicine

## 2021-02-04 DIAGNOSIS — I10 Essential (primary) hypertension: Secondary | ICD-10-CM

## 2021-02-04 MED ORDER — CARVEDILOL 12.5 MG PO TABS: 12.5000 mg | ORAL_TABLET | Freq: Two times a day (BID) | ORAL | 0 refills | Status: DC

## 2021-02-04 NOTE — Telephone Encounter (Signed)
Pt is currently out of medication and need a refill on carvedilol (COREG) 12.5 MG tablet.  [facility] #[id number] Ginette Otto, [place] - [address] AT [facility] OF [facility] & MARKET  [address] [place], [place] [address]  Phone:  [phone]  Fax:  [phone]

## 2021-02-04 NOTE — Telephone Encounter (Signed)
Refill sent.

## 2021-02-13 ENCOUNTER — Other Ambulatory Visit: Payer: Self-pay | Admitting: Family Medicine

## 2021-02-13 DIAGNOSIS — I10 Essential (primary) hypertension: Secondary | ICD-10-CM

## 2021-03-23 ENCOUNTER — Other Ambulatory Visit: Payer: Self-pay | Admitting: Family Medicine

## 2021-03-23 DIAGNOSIS — I10 Essential (primary) hypertension: Secondary | ICD-10-CM

## 2021-03-23 DIAGNOSIS — R600 Localized edema: Secondary | ICD-10-CM

## 2021-03-25 ENCOUNTER — Other Ambulatory Visit: Payer: Self-pay | Admitting: Family Medicine

## 2021-03-25 DIAGNOSIS — R600 Localized edema: Secondary | ICD-10-CM

## 2021-03-25 DIAGNOSIS — I10 Essential (primary) hypertension: Secondary | ICD-10-CM

## 2021-04-07 ENCOUNTER — Other Ambulatory Visit: Payer: Self-pay | Admitting: Family Medicine

## 2021-04-07 DIAGNOSIS — I10 Essential (primary) hypertension: Secondary | ICD-10-CM

## 2021-05-04 ENCOUNTER — Other Ambulatory Visit: Payer: Self-pay | Admitting: Family Medicine

## 2021-05-04 DIAGNOSIS — I10 Essential (primary) hypertension: Secondary | ICD-10-CM

## 2021-05-04 DIAGNOSIS — R600 Localized edema: Secondary | ICD-10-CM

## 2021-05-20 ENCOUNTER — Other Ambulatory Visit: Payer: Self-pay | Admitting: Family Medicine

## 2021-05-20 DIAGNOSIS — F321 Major depressive disorder, single episode, moderate: Secondary | ICD-10-CM

## 2021-05-20 DIAGNOSIS — F419 Anxiety disorder, unspecified: Secondary | ICD-10-CM

## 2021-05-21 NOTE — Telephone Encounter (Signed)
Requesting: Xanax Contract: N/A UDS: N/A  Last OV: 04/16/2020 Next OV: N/A Last Refill: 11/27/2020, #60--1 RF Database:   Please advise

## 2021-06-22 ENCOUNTER — Other Ambulatory Visit: Payer: Self-pay | Admitting: Family Medicine

## 2021-06-22 DIAGNOSIS — F321 Major depressive disorder, single episode, moderate: Secondary | ICD-10-CM

## 2021-06-22 DIAGNOSIS — F419 Anxiety disorder, unspecified: Secondary | ICD-10-CM

## 2021-07-22 ENCOUNTER — Other Ambulatory Visit: Payer: Self-pay | Admitting: Family Medicine

## 2021-07-22 DIAGNOSIS — I10 Essential (primary) hypertension: Secondary | ICD-10-CM

## 2021-08-15 ENCOUNTER — Other Ambulatory Visit: Payer: Self-pay | Admitting: Family Medicine

## 2021-08-15 DIAGNOSIS — I10 Essential (primary) hypertension: Secondary | ICD-10-CM

## 2021-08-15 DIAGNOSIS — F419 Anxiety disorder, unspecified: Secondary | ICD-10-CM

## 2021-08-15 DIAGNOSIS — F321 Major depressive disorder, single episode, moderate: Secondary | ICD-10-CM

## 2021-08-15 NOTE — Telephone Encounter (Signed)
Medication:  ?1.ALPRAZolam (XANAX) 0.5 MG tablet ?2.escitalopram (LEXAPRO) 20 MG tablet ?3.carvedilol (COREG) 12.5 MG tablet  ? ? ?Has the patient contacted their pharmacy?  Yes. ? ?Preferred Pharmacy (with phone number or street name): [facility] #[id number] Ginette Otto,  - [address] AT [facility] OF [facility] [place] & [address]  ?[address] [place], [place] [address]  ?Phone:  [phone]  Fax:  [phone]  ? ?Agent: Please be advised that RX refills may take up to 3 business days. We ask that you follow-up with your pharmacy.  ?

## 2021-08-16 ENCOUNTER — Other Ambulatory Visit: Payer: Self-pay | Admitting: Family Medicine

## 2021-08-16 DIAGNOSIS — I10 Essential (primary) hypertension: Secondary | ICD-10-CM

## 2021-08-16 DIAGNOSIS — F419 Anxiety disorder, unspecified: Secondary | ICD-10-CM

## 2021-08-16 MED ORDER — ESCITALOPRAM OXALATE 20 MG PO TABS: 20.0000 mg | ORAL_TABLET | Freq: Every day | ORAL | 0 refills | Status: DC

## 2021-08-16 MED ORDER — CARVEDILOL 12.5 MG PO TABS: ORAL_TABLET | ORAL | 0 refills | Status: DC

## 2021-08-16 MED ORDER — ALPRAZOLAM 0.5 MG PO TABS: 0.5000 mg | ORAL_TABLET | Freq: Three times a day (TID) | ORAL | 0 refills | Status: DC | PRN

## 2021-08-16 NOTE — Telephone Encounter (Signed)
Requesting: Xanax ?Contract: N/A ?UDS: N/A ?Last OV: 04/16/2020 ?Next OV: 09/16/2021 ?Last Refill: 05/24/21, #60--0 RF ?Database: ? ? ?Please advise  ? ?

## 2021-09-16 ENCOUNTER — Ambulatory Visit: Payer: BLUE CROSS/BLUE SHIELD | Admitting: Family Medicine

## 2021-09-23 ENCOUNTER — Ambulatory Visit (INDEPENDENT_AMBULATORY_CARE_PROVIDER_SITE_OTHER): Payer: Managed Care, Other (non HMO) | Admitting: Family Medicine

## 2021-09-23 ENCOUNTER — Other Ambulatory Visit: Payer: Self-pay | Admitting: Family Medicine

## 2021-09-23 ENCOUNTER — Encounter: Payer: Self-pay | Admitting: Family Medicine

## 2021-09-23 VITALS — BP 118/80 | HR 70 | Temp 97.4°F | Resp 18 | Ht 70.0 in | Wt 275.0 lb

## 2021-09-23 DIAGNOSIS — F419 Anxiety disorder, unspecified: Secondary | ICD-10-CM

## 2021-09-23 DIAGNOSIS — Z79899 Other long term (current) drug therapy: Secondary | ICD-10-CM

## 2021-09-23 DIAGNOSIS — Z1211 Encounter for screening for malignant neoplasm of colon: Secondary | ICD-10-CM

## 2021-09-23 DIAGNOSIS — I10 Essential (primary) hypertension: Secondary | ICD-10-CM | POA: Diagnosis not present

## 2021-09-23 DIAGNOSIS — M5441 Lumbago with sciatica, right side: Secondary | ICD-10-CM | POA: Diagnosis not present

## 2021-09-23 DIAGNOSIS — M5442 Lumbago with sciatica, left side: Secondary | ICD-10-CM

## 2021-09-23 DIAGNOSIS — R600 Localized edema: Secondary | ICD-10-CM

## 2021-09-23 MED ORDER — METHOCARBAMOL 500 MG PO TABS: 500.0000 mg | ORAL_TABLET | Freq: Four times a day (QID) | ORAL | 2 refills | Status: AC

## 2021-09-23 MED ORDER — FUROSEMIDE 40 MG PO TABS: ORAL_TABLET | ORAL | 3 refills | Status: DC

## 2021-09-23 MED ORDER — METHOCARBAMOL 500 MG IVPB - SIMPLE MED: 500.0000 mg | Freq: Four times a day (QID) | INTRAVENOUS | 1 refills | Status: DC | PRN

## 2021-09-23 MED ORDER — ALPRAZOLAM 0.5 MG PO TABS: 0.5000 mg | ORAL_TABLET | Freq: Three times a day (TID) | ORAL | 0 refills | Status: DC | PRN
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
## 2021-09-23 NOTE — Patient Instructions (Signed)

## 2021-09-23 NOTE — Assessment & Plan Note (Signed)
Well controlled, no changes to meds. Encouraged heart healthy diet such as the DASH diet and exercise as tolerated.  °

## 2021-09-24 LAB — CBC WITH DIFFERENTIAL/PLATELET
Basophils Absolute: 0.1 10*3/uL (ref 0.0–0.1)
Basophils Relative: 0.7 % (ref 0.0–3.0)
Eosinophils Absolute: 0.3 10*3/uL (ref 0.0–0.7)
Eosinophils Relative: 4.1 % (ref 0.0–5.0)
HCT: 49.4 % (ref 39.0–52.0)
Hemoglobin: 16.2 g/dL (ref 13.0–17.0)
Lymphocytes Relative: 33.6 % (ref 12.0–46.0)
Lymphs Abs: 2.4 10*3/uL (ref 0.7–4.0)
MCHC: 32.7 g/dL (ref 30.0–36.0)
MCV: 91.2 fl (ref 78.0–100.0)
Monocytes Absolute: 0.7 10*3/uL (ref 0.1–1.0)
Monocytes Relative: 9.9 % (ref 3.0–12.0)
Neutro Abs: 3.8 10*3/uL (ref 1.4–7.7)
Neutrophils Relative %: 51.7 % (ref 43.0–77.0)
Platelets: 291 10*3/uL (ref 150.0–400.0)
RBC: 5.42 Mil/uL (ref 4.22–5.81)
RDW: 15.4 % (ref 11.5–15.5)
WBC: 7.3 10*3/uL (ref 4.0–10.5)

## 2021-09-24 LAB — COMPREHENSIVE METABOLIC PANEL
ALT: 71 U/L — ABNORMAL HIGH (ref 0–53)
AST: 41 U/L — ABNORMAL HIGH (ref 0–37)
Albumin: 4.7 g/dL (ref 3.5–5.2)
Alkaline Phosphatase: 54 U/L (ref 39–117)
BUN: 12 mg/dL (ref 6–23)
CO2: 32 mEq/L (ref 19–32)
Calcium: 10 mg/dL (ref 8.4–10.5)
Chloride: 98 mEq/L (ref 96–112)
Creatinine, Ser: 1.29 mg/dL (ref 0.40–1.50)
GFR: 61.47 mL/min (ref >60.00)
Glucose, Bld: 97 mg/dL (ref 70–99)
Potassium: 4.1 mEq/L (ref 3.5–5.1)
Sodium: 138 mEq/L (ref 135–145)
Total Bilirubin: 0.9 mg/dL (ref 0.2–1.2)
Total Protein: 7.5 g/dL (ref 6.0–8.3)

## 2021-09-24 LAB — LIPID PANEL
Cholesterol: 228 mg/dL — ABNORMAL HIGH (ref 0–200)
HDL: 41.5 mg/dL (ref >39.00)
LDL Cholesterol: 154 mg/dL — ABNORMAL HIGH (ref 0–99)
NonHDL: 186.2
Total CHOL/HDL Ratio: 5
Triglycerides: 161 mg/dL — ABNORMAL HIGH (ref 0.0–149.0)
VLDL: 32.2 mg/dL (ref 0.0–40.0)

## 2021-09-25 LAB — DRUG MONITORING PANEL 376104, URINE

## 2021-09-25 LAB — DM TEMPLATE

## 2021-10-11 ENCOUNTER — Other Ambulatory Visit: Payer: Self-pay | Admitting: Family Medicine

## 2021-10-11 DIAGNOSIS — I10 Essential (primary) hypertension: Secondary | ICD-10-CM

## 2021-10-11 DIAGNOSIS — R600 Localized edema: Secondary | ICD-10-CM

## 2021-10-13 ENCOUNTER — Other Ambulatory Visit: Payer: Self-pay | Admitting: Family Medicine

## 2021-10-13 DIAGNOSIS — F419 Anxiety disorder, unspecified: Secondary | ICD-10-CM

## 2021-10-13 DIAGNOSIS — I10 Essential (primary) hypertension: Secondary | ICD-10-CM

## 2021-10-13 DIAGNOSIS — F321 Major depressive disorder, single episode, moderate: Secondary | ICD-10-CM

## 2021-10-22 ENCOUNTER — Other Ambulatory Visit: Payer: Self-pay | Admitting: Family Medicine

## 2021-10-22 DIAGNOSIS — I10 Essential (primary) hypertension: Secondary | ICD-10-CM

## 2021-11-15 ENCOUNTER — Other Ambulatory Visit: Payer: Self-pay | Admitting: Family Medicine

## 2021-11-15 DIAGNOSIS — F419 Anxiety disorder, unspecified: Secondary | ICD-10-CM

## 2022-01-11 ENCOUNTER — Other Ambulatory Visit: Payer: Self-pay | Admitting: Family Medicine

## 2022-01-11 DIAGNOSIS — I10 Essential (primary) hypertension: Secondary | ICD-10-CM

## 2022-01-29 ENCOUNTER — Other Ambulatory Visit: Payer: Self-pay | Admitting: Family Medicine

## 2022-01-29 DIAGNOSIS — F419 Anxiety disorder, unspecified: Secondary | ICD-10-CM

## 2022-01-30 NOTE — Telephone Encounter (Signed)
Patient is requesting a refill of the following medications: Requested Prescriptions   Pending Prescriptions Disp Refills   ALPRAZolam (XANAX) 0.5 MG tablet [Pharmacy Med Name: ALPRAZOLAM 0.5MG  TABLETS] 60 tablet     Sig: TAKE 1 TABLET(0.5 MG) BY MOUTH THREE TIMES DAILY AS NEEDED   Contract & UDS: 09/23/21   Last Visit: 09/23/21 Next Visit: None Last Refill: 11/18/21 #60 + 0  Please Advise

## 2022-02-10 ENCOUNTER — Encounter: Payer: Self-pay | Admitting: Family Medicine

## 2022-05-12 ENCOUNTER — Other Ambulatory Visit: Payer: Self-pay | Admitting: Family Medicine

## 2022-05-12 DIAGNOSIS — F419 Anxiety disorder, unspecified: Secondary | ICD-10-CM

## 2022-05-12 NOTE — Telephone Encounter (Signed)
Requesting: alprazolam 0.5mg   Contract: 09/23/21 UDS: 09/23/21 Last Visit: 09/23/21 Next Visit: None Last Refill: 01/30/22 #60 and 1RF   Please Advise

## 2022-07-13 ENCOUNTER — Other Ambulatory Visit: Payer: Self-pay | Admitting: Family Medicine

## 2022-07-13 DIAGNOSIS — F419 Anxiety disorder, unspecified: Secondary | ICD-10-CM

## 2022-07-13 DIAGNOSIS — F321 Major depressive disorder, single episode, moderate: Secondary | ICD-10-CM

## 2022-07-13 DIAGNOSIS — R600 Localized edema: Secondary | ICD-10-CM

## 2022-07-13 DIAGNOSIS — I10 Essential (primary) hypertension: Secondary | ICD-10-CM

## 2022-08-12 ENCOUNTER — Other Ambulatory Visit: Payer: Self-pay | Admitting: Family Medicine

## 2022-08-12 DIAGNOSIS — F321 Major depressive disorder, single episode, moderate: Secondary | ICD-10-CM

## 2022-08-12 DIAGNOSIS — R600 Localized edema: Secondary | ICD-10-CM

## 2022-08-12 DIAGNOSIS — I10 Essential (primary) hypertension: Secondary | ICD-10-CM

## 2022-08-12 DIAGNOSIS — F419 Anxiety disorder, unspecified: Secondary | ICD-10-CM

## 2022-09-01 ENCOUNTER — Other Ambulatory Visit: Payer: Self-pay | Admitting: Family Medicine

## 2022-09-01 DIAGNOSIS — F419 Anxiety disorder, unspecified: Secondary | ICD-10-CM

## 2022-09-02 ENCOUNTER — Telehealth: Payer: Self-pay | Admitting: Family Medicine

## 2022-09-02 DIAGNOSIS — I10 Essential (primary) hypertension: Secondary | ICD-10-CM

## 2022-09-02 NOTE — Telephone Encounter (Signed)
Pt called to make appt. Requested refill on amLODipine-valsartan (EXFORGE) 10-320 MG tablet to be called into [facility] #[id number] Ginette Otto, [place] - [address] AT [facility] OF [facility] & MARKET [address] [place] [address] [place], [place] [address] Phone: [phone]  Fax: [phone]

## 2022-09-03 MED ORDER — AMLODIPINE BESYLATE-VALSARTAN 10-320 MG PO TABS: 1.0000 | ORAL_TABLET | Freq: Every day | ORAL | 0 refills | Status: DC

## 2022-09-03 NOTE — Addendum Note (Signed)
Addended by: Roxanne Gates on: 09/03/2022 08:40 AM   Modules accepted: Orders

## 2022-09-03 NOTE — Telephone Encounter (Signed)
Rx sent. Pt has appt on 05/02

## 2022-09-05 ENCOUNTER — Other Ambulatory Visit: Payer: Self-pay | Admitting: Family Medicine

## 2022-09-05 DIAGNOSIS — F419 Anxiety disorder, unspecified: Secondary | ICD-10-CM

## 2022-09-05 NOTE — Telephone Encounter (Signed)
Pt is scheduled for an appt on 5.2.24 and wants to see about getting a supply to last him until then.   Prescription Request  09/05/2022  Is this a "Controlled Substance" medicine? Yes  LOV: Visit date not found  What is the name of the medication or equipment?   ALPRAZolam (XANAX) 0.5 MG tablet [[id number]]   Have you contacted your pharmacy to request a refill? Yes   Which pharmacy would you like this sent to?  [facility] #[id number] Ginette Otto, [place] - [address] AT [facility] OF [facility] [place] & [address] [place] [address] Phone: [phone] Fax: [phone]    Patient notified that their request is being sent to the clinical staff for review and that they should receive a response within 2 business days.   Please advise at Mobile [phone] (mobile)

## 2022-09-05 NOTE — Addendum Note (Signed)
Addended by: Roxanne Gates on: 09/05/2022 04:00 PM   Modules accepted: Orders

## 2022-09-05 NOTE — Telephone Encounter (Signed)
Requesting: Xanax Contract: 09/23/2021 UDS: 09/23/2021 Last OV: 09/23/2021 Next OV: 09/25/2022 Last Refill: 05/12/2022, #60--1 RF Database:   Please advise

## 2022-09-07 MED ORDER — ALPRAZOLAM 0.5 MG PO TABS: ORAL_TABLET | ORAL | 1 refills | Status: DC

## 2022-09-16 ENCOUNTER — Other Ambulatory Visit: Payer: Self-pay | Admitting: Family Medicine

## 2022-09-16 DIAGNOSIS — I10 Essential (primary) hypertension: Secondary | ICD-10-CM

## 2022-09-25 ENCOUNTER — Ambulatory Visit: Payer: Managed Care, Other (non HMO) | Admitting: Family Medicine

## 2022-10-01 ENCOUNTER — Telehealth: Payer: Self-pay | Admitting: Family Medicine

## 2022-10-01 DIAGNOSIS — I10 Essential (primary) hypertension: Secondary | ICD-10-CM

## 2022-10-01 NOTE — Telephone Encounter (Signed)
Patient called and needed to r/s appt for 5.10 as he has COVID, appt was r/s for 5.17. Patient did say he was out of Amlodipine-valsartan.

## 2022-10-02 MED ORDER — AMLODIPINE BESYLATE-VALSARTAN 10-320 MG PO TABS: 1.0000 | ORAL_TABLET | Freq: Every day | ORAL | 0 refills | Status: DC

## 2022-10-02 NOTE — Telephone Encounter (Signed)
30 day supply sent

## 2022-10-03 ENCOUNTER — Ambulatory Visit: Payer: Managed Care, Other (non HMO) | Admitting: Family Medicine

## 2022-10-09 NOTE — Progress Notes (Signed)
Subjective:   By signing my name below, I, Isabelle Course, attest that this documentation has been prepared under the direction and in the presence of Donato Schultz, DO 10/10/22   Patient ID: Brad Ellis, male    DOB: 13-Sep-1965, 57 y.o.   MRN: [id number]  Chief Complaint  Patient presents with   Hypertension   Depression   Follow-up    HPI Patient is in today for an office visit.   His blood pressure is well managed with Amlodipine. His lower extremity swelling has also improved with 40 mg Lasix.  His mood is stable and has slightly improved since his last visit. He is compliant with 0.5 mg Xanax. He has been exercising more. He has been using his apple watch to help him stay motivated with his overall health.   He also complains of a rash on his lower abdomen, chest and back. He believes its from the stream room.    Past Medical History:  Diagnosis Date   HTN (hypertension)     History reviewed. No pertinent surgical history.  No family history on file.  Social History   Socioeconomic History   Marital status: Single    Spouse name: Not on file   Number of children: Not on file   Years of education: Not on file   Highest education level: Not on file  Occupational History   Not on file  Tobacco Use   Smoking status: Never   Smokeless tobacco: Never  Substance and Sexual Activity   Alcohol use: No   Drug use: No   Sexual activity: Not on file  Other Topics Concern   Not on file  Social History Narrative   Not on file   Social Determinants of Health   Financial Resource Strain: Not on file  Food Insecurity: Not on file  Transportation Needs: Not on file  Physical Activity: Not on file  Stress: Not on file  Social Connections: Not on file  Intimate Partner Violence: Not on file    Outpatient Medications Prior to Visit  Medication Sig Dispense Refill   diclofenac (VOLTAREN) 75 MG EC tablet Take 1 tablet (75 mg total) by mouth 2 (two) times  daily. 20 tablet 0   methocarbamol (ROBAXIN) 500 MG tablet Take 1 tablet (500 mg total) by mouth 4 (four) times daily. 45 tablet 2   ALPRAZolam (XANAX) 0.5 MG tablet TAKE 1 TABLET(0.5 MG) BY MOUTH THREE TIMES DAILY AS NEEDED 60 tablet 1   amLODipine-valsartan (EXFORGE) 10-320 MG tablet Take 1 tablet by mouth daily. 30 tablet 0   carvedilol (COREG) 12.5 MG tablet TAKE 1 TABLET(12.5 MG) BY MOUTH TWICE DAILY WITH A MEAL 180 tablet 1   escitalopram (LEXAPRO) 20 MG tablet Take 1 tablet (20 mg total) by mouth daily. 90 tablet 1   furosemide (LASIX) 40 MG tablet Take 1 tablet (40 mg total) by mouth daily. 90 tablet 1   No facility-administered medications prior to visit.    No Known Allergies  Review of Systems  Constitutional:  Negative for fever and malaise/fatigue.  HENT:  Negative for congestion.   Eyes:  Negative for blurred vision.  Respiratory:  Negative for shortness of breath.   Cardiovascular:  Negative for chest pain, palpitations and leg swelling.  Gastrointestinal:  Negative for abdominal pain, blood in stool and nausea.  Genitourinary:  Negative for dysuria and frequency.  Musculoskeletal:  Negative for falls.  Skin:  Positive for rash.  Neurological:  Negative for  dizziness, loss of consciousness and headaches.  Endo/Heme/Allergies:  Negative for environmental allergies.  Psychiatric/Behavioral:  Negative for depression. The patient is not nervous/anxious.        Objective:    Physical Exam Vitals and nursing note reviewed.  Constitutional:      Appearance: He is well-developed.  HENT:     Head: Normocephalic and atraumatic.     Right Ear: External ear normal.     Left Ear: External ear normal.  Eyes:     General:        Right eye: No discharge.        Left eye: No discharge.     Conjunctiva/sclera: Conjunctivae normal.     Pupils: Pupils are equal, round, and reactive to light.  Neck:     Thyroid: No thyromegaly.  Cardiovascular:     Rate and Rhythm: Normal  rate and regular rhythm.     Heart sounds: Normal heart sounds. No murmur heard. Pulmonary:     Effort: Pulmonary effort is normal. No respiratory distress.     Breath sounds: Normal breath sounds. No wheezing or rales.  Chest:     Chest wall: No tenderness.  Abdominal:     General: Abdomen is flat.     Palpations: Abdomen is soft.     Tenderness: There is no abdominal tenderness.  Musculoskeletal:        General: No tenderness. Normal range of motion.     Cervical back: Normal range of motion and neck supple.     Right hip: Normal range of motion. Normal strength.     Left hip: Normal range of motion. Normal strength.     Right foot: Bony tenderness present. No swelling.     Left foot: Bony tenderness present. No swelling.  Lymphadenopathy:     Cervical: No cervical adenopathy.  Skin:    General: Skin is warm and dry.     Findings: Rash present.     Comments: After visit the pt pulled the cma aside so show her a rash on his chest that is itchy--- he thinks is from the steam room   Neurological:     General: No focal deficit present.     Mental Status: He is alert and oriented to person, place, and time.  Psychiatric:        Behavior: Behavior normal.        Thought Content: Thought content normal.        Judgment: Judgment normal.     BP 120/70 (BP Location: Left Arm, Patient Position: Sitting, Cuff Size: Large)   Pulse 75   Temp 98 F (36.7 C) (Oral)   Resp 18   Ht 5\' 10"  (1.778 m)   Wt 269 lb 3.2 oz (122.1 kg)   SpO2 93%   BMI 38.63 kg/m  Wt Readings from Last 3 Encounters:  10/10/22 269 lb 3.2 oz (122.1 kg)  09/23/21 275 lb (124.7 kg)  06/28/18 252 lb (114.3 kg)       Assessment & Plan:  Essential hypertension Assessment & Plan: Well controlled, no changes to meds. Encouraged heart healthy diet such as the DASH diet and exercise as tolerated.    Orders: -     Carvedilol; 1 po bid  Dispense: 180 tablet; Refill: 3 -     TSH -     CBC with  Differential/Platelet -     Comprehensive metabolic panel -     Lipid panel -     PSA  HTN (hypertension) -     amLODIPine Besylate-Valsartan; Take 1 tablet by mouth daily.  Dispense: 90 tablet; Refill: 3 -     Furosemide; Take 1 tablet (40 mg total) by mouth daily.  Dispense: 90 tablet; Refill: 3  Lower extremity edema Assessment & Plan: Elevate legs and con't diuretic   Orders: -     Furosemide; Take 1 tablet (40 mg total) by mouth daily.  Dispense: 90 tablet; Refill: 3 -     TSH -     CBC with Differential/Platelet -     Comprehensive metabolic panel -     Lipid panel -     PSA  Anxiety Assessment & Plan: Stable  Con't celexa and xanax  Uds ad contract updated   Orders: -     Escitalopram Oxalate; Take 1 tablet (20 mg total) by mouth daily.  Dispense: 90 tablet; Refill: 3 -     ALPRAZolam; TAKE 1 TABLET(0.5 MG) BY MOUTH THREE TIMES DAILY AS NEEDED  Dispense: 60 tablet; Refill: 1 -     TSH -     CBC with Differential/Platelet -     Comprehensive metabolic panel -     Lipid panel -     PSA  Depression, major, single episode, moderate (HCC) -     Escitalopram Oxalate; Take 1 tablet (20 mg total) by mouth daily.  Dispense: 90 tablet; Refill: 3  High risk medication use -     Drug Monitoring Panel [phone] , Urine  Rash -     Clotrimazole-Betamethasone; Apply 1 Application topically daily.  Dispense: 30 g; Refill: 0     I,Rachel Rivera,acting as a scribe for Donato Schultz, DO.,have documented all relevant documentation on the behalf of Donato Schultz, DO,as directed by  Donato Schultz, DO while in the presence of Donato Schultz, DO.   I, Donato Schultz, DO, personally preformed the services described in this documentation.  All medical record entries made by the scribe were at my direction and in my presence.  I have reviewed the chart and discharge instructions (if applicable) and agree that the record reflects my personal performance  and is accurate and complete. 10/10/22   Donato Schultz, DO

## 2022-10-10 ENCOUNTER — Encounter: Payer: Self-pay | Admitting: Family Medicine

## 2022-10-10 ENCOUNTER — Ambulatory Visit (INDEPENDENT_AMBULATORY_CARE_PROVIDER_SITE_OTHER): Payer: Managed Care, Other (non HMO) | Admitting: Family Medicine

## 2022-10-10 VITALS — BP 120/70 | HR 75 | Temp 98.0°F | Resp 18 | Ht 70.0 in | Wt 269.2 lb

## 2022-10-10 DIAGNOSIS — F321 Major depressive disorder, single episode, moderate: Secondary | ICD-10-CM

## 2022-10-10 DIAGNOSIS — I10 Essential (primary) hypertension: Secondary | ICD-10-CM

## 2022-10-10 DIAGNOSIS — Z79899 Other long term (current) drug therapy: Secondary | ICD-10-CM

## 2022-10-10 DIAGNOSIS — F419 Anxiety disorder, unspecified: Secondary | ICD-10-CM | POA: Diagnosis not present

## 2022-10-10 DIAGNOSIS — R21 Rash and other nonspecific skin eruption: Secondary | ICD-10-CM

## 2022-10-10 DIAGNOSIS — R600 Localized edema: Secondary | ICD-10-CM | POA: Diagnosis not present

## 2022-10-10 LAB — CBC WITH DIFFERENTIAL/PLATELET
Absolute Monocytes: 664 cells/uL (ref 200–950)
Basophils Relative: 0.4 %
Eosinophils Relative: 5.1 %
Hemoglobin: 15.2 g/dL (ref 13.2–17.1)
MCH: 28.2 pg (ref 27.0–33.0)
MPV: 10.5 fL (ref 7.5–12.5)
Platelets: 331 10*3/uL (ref 140–400)
RBC: 5.39 10*6/uL (ref 4.20–5.80)
RDW: 14.4 % (ref 11.0–15.0)

## 2022-10-10 LAB — TSH: TSH: 1.56 u[IU]/mL (ref 0.35–5.50)

## 2022-10-10 MED ORDER — CLOTRIMAZOLE-BETAMETHASONE 1-0.05 % EX CREA
1.0000 | TOPICAL_CREAM | Freq: Every day | CUTANEOUS | 0 refills | Status: DC
Start: 2022-10-10 — End: 2023-10-09

## 2022-10-10 MED ORDER — FUROSEMIDE 40 MG PO TABS: 40.0000 mg | ORAL_TABLET | Freq: Every day | ORAL | 3 refills | Status: DC

## 2022-10-10 MED ORDER — CARVEDILOL 12.5 MG PO TABS: ORAL_TABLET | ORAL | 3 refills | Status: DC

## 2022-10-10 MED ORDER — ALPRAZOLAM 0.5 MG PO TABS: ORAL_TABLET | ORAL | 1 refills | Status: DC

## 2022-10-10 MED ORDER — AMLODIPINE BESYLATE-VALSARTAN 10-320 MG PO TABS: 1.0000 | ORAL_TABLET | Freq: Every day | ORAL | 3 refills | Status: DC

## 2022-10-10 MED ORDER — ESCITALOPRAM OXALATE 20 MG PO TABS: 20.0000 mg | ORAL_TABLET | Freq: Every day | ORAL | 3 refills | Status: DC

## 2022-10-10 NOTE — Assessment & Plan Note (Signed)
Stable  Con't celexa and xanax  Uds ad contract updated

## 2022-10-10 NOTE — Assessment & Plan Note (Signed)
Elevate legs and con't diuretic

## 2022-10-10 NOTE — Patient Instructions (Signed)

## 2022-10-10 NOTE — Assessment & Plan Note (Signed)
Well controlled, no changes to meds. Encouraged heart healthy diet such as the DASH diet and exercise as tolerated.  °

## 2022-10-11 LAB — COMPREHENSIVE METABOLIC PANEL
AG Ratio: 1.7 (calc) (ref 1.0–2.5)
ALT: 57 U/L — ABNORMAL HIGH (ref 9–46)
AST: 34 U/L (ref 10–35)
Albumin: 4.3 g/dL (ref 3.6–5.1)
Alkaline phosphatase (APISO): 48 U/L (ref 35–144)
BUN: 15 mg/dL (ref 7–25)
CO2: 25 mmol/L (ref 20–32)
Calcium: 9.8 mg/dL (ref 8.6–10.3)
Chloride: 100 mmol/L (ref 98–110)
Creat: 0.97 mg/dL (ref 0.70–1.30)
Globulin: 2.6 g/dL (calc) (ref 1.9–3.7)
Glucose, Bld: 121 mg/dL — ABNORMAL HIGH (ref 65–99)
Potassium: 3.9 mmol/L (ref 3.5–5.3)
Sodium: 138 mmol/L (ref 135–146)
Total Bilirubin: 0.9 mg/dL (ref 0.2–1.2)
Total Protein: 6.9 g/dL (ref 6.1–8.1)

## 2022-10-11 LAB — CBC WITH DIFFERENTIAL/PLATELET
Basophils Absolute: 29 cells/uL (ref 0–200)
Eosinophils Absolute: 372 cells/uL (ref 15–500)
HCT: 45.3 % (ref 38.5–50.0)
Lymphs Abs: 2635 cells/uL (ref 850–3900)
MCHC: 33.6 g/dL (ref 32.0–36.0)
MCV: 84 fL (ref 80.0–100.0)
Monocytes Relative: 9.1 %
Neutro Abs: 3599 cells/uL (ref 1500–7800)
Neutrophils Relative %: 49.3 %
Total Lymphocyte: 36.1 %
WBC: 7.3 10*3/uL (ref 3.8–10.8)

## 2022-10-11 LAB — LIPID PANEL
Cholesterol: 194 mg/dL (ref <200)
HDL: 36 mg/dL — ABNORMAL LOW (ref >=40)
LDL Cholesterol (Calc): 133 mg/dL (calc) — ABNORMAL HIGH
Non-HDL Cholesterol (Calc): 158 mg/dL (calc) — ABNORMAL HIGH (ref <130)
Total CHOL/HDL Ratio: 5.4 (calc) — ABNORMAL HIGH (ref <5.0)
Triglycerides: 130 mg/dL (ref <150)

## 2022-10-11 LAB — PSA: PSA: 0.67 ng/mL (ref <=4.00)

## 2022-10-12 LAB — DM TEMPLATE

## 2022-10-12 LAB — DRUG MONITORING PANEL 376104, URINE: Desmethyltramadol: NEGATIVE ng/mL (ref <100)

## 2022-10-13 LAB — DRUG MONITORING PANEL 376104, URINE
Alphahydroxyalprazolam: 154 ng/mL — ABNORMAL HIGH (ref <25)
Alphahydroxymidazolam: NEGATIVE ng/mL (ref <50)
Alphahydroxytriazolam: NEGATIVE ng/mL (ref <50)
Aminoclonazepam: NEGATIVE ng/mL (ref <25)
Amphetamines: NEGATIVE ng/mL (ref <500)
Barbiturates: NEGATIVE ng/mL (ref <300)
Benzodiazepines: POSITIVE ng/mL — AB (ref <100)
Cocaine Metabolite: NEGATIVE ng/mL (ref <150)
Codeine: NEGATIVE ng/mL (ref <50)
Desmethyltramadol: NEGATIVE ng/mL (ref <100)
Hydrocodone: NEGATIVE ng/mL (ref <50)
Hydromorphone: NEGATIVE ng/mL (ref <50)
Hydroxyethylflurazepam: NEGATIVE ng/mL (ref <50)
Lorazepam: NEGATIVE ng/mL (ref <50)
Morphine: 301 ng/mL — ABNORMAL HIGH (ref <50)
Nordiazepam: NEGATIVE ng/mL (ref <50)
Norhydrocodone: NEGATIVE ng/mL (ref <50)
Opiates: POSITIVE ng/mL — AB (ref <100)
Oxazepam: NEGATIVE ng/mL (ref <50)
Oxycodone: NEGATIVE ng/mL (ref <100)
Temazepam: NEGATIVE ng/mL (ref <50)
Tramadol: NEGATIVE ng/mL (ref <100)

## 2022-10-14 ENCOUNTER — Telehealth: Payer: Self-pay | Admitting: Family Medicine

## 2022-10-14 NOTE — Telephone Encounter (Signed)
Pt called stating that he was concerned about his UDS showing up for morphine as hew stated he has never done anything like that. Pt would like to look into this further and see what is going on with this test and advise him of any info we are able to obtain.

## 2022-10-15 NOTE — Telephone Encounter (Signed)
Spoke with [facility]. The [facility] rep pretty much said the same thing the lab said Morphine can be a prescribed drug and is also a  metabolite of Codeine and Heroin. Low concentrations of Morphine have been observed following ingestion of products containing poppy seeds.  The rep stated that medications and foods are metabolized differently person to person. He states we and the patient would have to determine where the Morphine came from.

## 2022-10-15 NOTE — Telephone Encounter (Signed)
Spoke with patient. Pt states he has not taken any pain meds or had any procedures recently. Please advise

## 2022-10-17 NOTE — Telephone Encounter (Signed)
Pt called. LVM asking if patient wants to redo UDS

## 2022-10-22 ENCOUNTER — Other Ambulatory Visit: Payer: Self-pay | Admitting: Family Medicine

## 2022-10-22 DIAGNOSIS — R739 Hyperglycemia, unspecified: Secondary | ICD-10-CM

## 2022-12-03 ENCOUNTER — Other Ambulatory Visit: Payer: Self-pay | Admitting: Family Medicine

## 2022-12-03 DIAGNOSIS — F419 Anxiety disorder, unspecified: Secondary | ICD-10-CM

## 2022-12-04 NOTE — Telephone Encounter (Signed)
Requesting: alprazolam 0.5mg   Contract: 10/10/22 UDS: 10/10/22 Last Visit: 10/10/22 Next Visit: None Last Refill: 10/10/22 #60 and 1RF  Please Advise

## 2023-02-23 ENCOUNTER — Other Ambulatory Visit: Payer: Self-pay | Admitting: Family Medicine

## 2023-02-23 DIAGNOSIS — F419 Anxiety disorder, unspecified: Secondary | ICD-10-CM

## 2023-02-24 NOTE — Telephone Encounter (Signed)
Requesting: alprazolam 0.5mg   Contract: 10/10/22 UDS: 10/10/22 Last Visit: 10/10/22 Next Visit: None Last Refill: 12/04/22 #60 and 1RF   Please Advise

## 2023-04-09 ENCOUNTER — Other Ambulatory Visit: Payer: Self-pay | Admitting: Family Medicine

## 2023-04-09 DIAGNOSIS — F419 Anxiety disorder, unspecified: Secondary | ICD-10-CM

## 2023-04-09 NOTE — Telephone Encounter (Signed)
Requesting: Xanax 0.5mg  Contract: 10/10/2022 UDS: 10/10/2022 Last Visit: 10/10/2022 Next Visit: N/A Last Refill: 02/24/2023  Please Advise

## 2023-05-15 ENCOUNTER — Telehealth: Payer: Self-pay

## 2023-05-15 NOTE — Telephone Encounter (Signed)
  Copied from CRM [id number]. Topic: Clinical - Medication Refill >> May 14, 2023  4:58 PM Clayton Bibles wrote: Most Recent Primary Care Visit:  Provider: Seabron Spates R  Department: [facility]  Visit Type: OFFICE VISIT  Date: 10/10/2022  Medication: GLP1 for Prediabetes   Has the patient contacted their pharmacy? No (Agent: If no, request that the patient contact the pharmacy for the refill. If patient does not wish to contact the pharmacy document the reason why and proceed with request.) (Agent: If yes, when and what did the pharmacy advise?)  Is this the correct pharmacy for this prescription? Yes  If no, delete pharmacy and type the correct one.  This is the patient's preferred pharmacy:  [id number] [place], [place] - [address] AT [facility] [address] [place] [address] Phone: [phone] Fax: [phone]   Has the prescription been filled recently? No  Is the patient out of the medication? Yes - has not taken in a while  Has the patient been seen for an appointment in the last year OR does the patient have an upcoming appointment? Yes  Can we respond through MyChart? Yes  Agent: Please be advised that Rx refills may take up to 3 business days. We ask that you follow-up with your pharmacy.

## 2023-05-15 NOTE — Telephone Encounter (Signed)
Pt was called and scheduled for 12.26.24

## 2023-05-15 NOTE — Telephone Encounter (Signed)
Reviewed patients chart and medication history and did not see medication for diabetes and do not see DM on patients problem list. Pt needs appointment please

## 2023-05-21 ENCOUNTER — Ambulatory Visit: Payer: Managed Care, Other (non HMO) | Admitting: Family Medicine

## 2023-06-01 ENCOUNTER — Ambulatory Visit (INDEPENDENT_AMBULATORY_CARE_PROVIDER_SITE_OTHER): Payer: Managed Care, Other (non HMO) | Admitting: Family Medicine

## 2023-06-01 ENCOUNTER — Encounter: Payer: Self-pay | Admitting: Family Medicine

## 2023-06-01 VITALS — BP 179/101 | HR 70 | Temp 97.7°F | Ht 70.0 in | Wt 284.5 lb

## 2023-06-01 DIAGNOSIS — R739 Hyperglycemia, unspecified: Secondary | ICD-10-CM | POA: Diagnosis not present

## 2023-06-01 DIAGNOSIS — I10 Essential (primary) hypertension: Secondary | ICD-10-CM | POA: Diagnosis not present

## 2023-06-01 DIAGNOSIS — Z23 Encounter for immunization: Secondary | ICD-10-CM | POA: Diagnosis not present

## 2023-06-01 DIAGNOSIS — R110 Nausea: Secondary | ICD-10-CM

## 2023-06-01 DIAGNOSIS — F419 Anxiety disorder, unspecified: Secondary | ICD-10-CM | POA: Diagnosis not present

## 2023-06-01 MED ORDER — ONDANSETRON HCL 4 MG PO TABS: 4.0000 mg | ORAL_TABLET | Freq: Three times a day (TID) | ORAL | 0 refills | Status: AC | PRN

## 2023-06-01 MED ORDER — ZEPBOUND 7.5 MG/0.5ML ~~LOC~~ SOAJ: 7.5000 mg | SUBCUTANEOUS | 0 refills | Status: AC

## 2023-06-01 MED ORDER — ALPRAZOLAM 0.5 MG PO TABS: ORAL_TABLET | ORAL | 1 refills | Status: DC

## 2023-06-01 NOTE — Progress Notes (Signed)
 Established Patient Office Visit  Subjective   Patient ID: Brad Ellis, male    DOB: 1966-04-12  Age: 58 y.o. MRN: [id number]  Chief Complaint  Patient presents with   Diabetes    Patient here to talk about medication for pre diabetes No other concerns.     HPI Discussed the use of AI scribe software for clinical note transcription with the patient, who gave verbal consent to proceed.  History of Present Illness   The patient, with a history of high blood sugar levels, presents with concerns about his current weight and blood sugar management. He reports a previous successful weight loss period in 2019, during which he engaged in regular exercise and maintained a healthy diet. However, he has since regained the weight and is planning to return to his previous regimen.  The patient was previously on semaglutide and tirzepatide , which he obtained through a friend's clinic. He reports that these medications were effective in managing his blood sugar levels and aiding in weight loss, with only minor side effects of mild nausea. However, due to the high cost of these medications, the patient is seeking alternatives.  The patient also reports a history of high blood pressure, which he monitors regularly at home. He is currently on medication for this condition, which he takes daily. He notes that his blood pressure readings at home are typically lower than those recorded during medical appointments.  The patient is committed to improving his health and is actively seeking solutions to manage his weight and blood sugar levels. He expresses a willingness to try new medications if they are affordable and effective.      Patient Active Problem List   Diagnosis Date Noted   Lower extremity edema 10/10/2022   Anxiety 12/24/2017   Essential hypertension 07/09/2009   Past Medical History:  Diagnosis Date   HTN (hypertension)    No past surgical history on file. Social History   Tobacco  Use   Smoking status: Never   Smokeless tobacco: Never  Substance Use Topics   Alcohol use: No   Drug use: No   Social History   Socioeconomic History   Marital status: Single    Spouse name: Not on file   Number of children: Not on file   Years of education: Not on file   Highest education level: Doctorate  Occupational History   Not on file  Tobacco Use   Smoking status: Never   Smokeless tobacco: Never  Substance and Sexual Activity   Alcohol use: No   Drug use: No   Sexual activity: Not on file  Other Topics Concern   Not on file  Social History Narrative   Not on file   Social Drivers of Health   Financial Resource Strain: Low Risk  (05/31/2023)   Overall Financial Resource Strain (CARDIA)    Difficulty of Paying Living Expenses: Not very hard  Food Insecurity: No Food Insecurity (05/31/2023)   Hunger Vital Sign    Worried About Running Out of Food in the Last Year: Never true    Ran Out of Food in the Last Year: Never true  Transportation Needs: No Transportation Needs (05/31/2023)   PRAPARE - Administrator, Civil Service (Medical): No    Lack of Transportation (Non-Medical): No  Physical Activity: Sufficiently Active (05/31/2023)   Exercise Vital Sign    Days of Exercise per Week: 6 days    Minutes of Exercise per Session: 50 min  Stress:  Stress Concern Present (05/31/2023)   [employer] of Occupational Health - Occupational Stress Questionnaire    Feeling of Stress : To some extent  Social Connections: Socially Integrated (05/31/2023)   Social Connection and Isolation Panel [NHANES]    Frequency of Communication with Friends and Family: More than three times a week    Frequency of Social Gatherings with Friends and Family: Three times a week    Attends Religious Services: More than 4 times per year    Active Member of Clubs or Organizations: Yes    Attends Engineer, Structural: More than 4 times per year    Marital Status: Married   Catering Manager Violence: Not on file   Family Status  Relation Name Status   Father  Deceased  No partnership data on file   No family history on file. No Known Allergies    Review of Systems  Constitutional:  Negative for chills, fever and malaise/fatigue.  HENT:  Negative for congestion and hearing loss.   Eyes:  Negative for blurred vision and discharge.  Respiratory:  Negative for cough, sputum production and shortness of breath.   Cardiovascular:  Negative for chest pain, palpitations and leg swelling.  Gastrointestinal:  Negative for abdominal pain, blood in stool, constipation, diarrhea, heartburn, nausea and vomiting.  Genitourinary:  Negative for dysuria, frequency, hematuria and urgency.  Musculoskeletal:  Negative for back pain, falls and myalgias.  Skin:  Negative for rash.  Neurological:  Negative for dizziness, sensory change, loss of consciousness, weakness and headaches.  Endo/Heme/Allergies:  Negative for environmental allergies. Does not bruise/bleed easily.  Psychiatric/Behavioral:  Negative for depression and suicidal ideas. The patient is not nervous/anxious and does not have insomnia.       Objective:     BP (!) 179/101   Pulse 70   Temp 97.7 F (36.5 C) (Oral)   Ht 5' 10 (1.778 m)   Wt 284 lb 8 oz (129 kg)   SpO2 95%   BMI 40.82 kg/m  BP Readings from Last 3 Encounters:  06/01/23 (!) 179/101  10/10/22 120/70  09/23/21 118/80   Wt Readings from Last 3 Encounters:  06/01/23 284 lb 8 oz (129 kg)  10/10/22 269 lb 3.2 oz (122.1 kg)  09/23/21 275 lb (124.7 kg)   SpO2 Readings from Last 3 Encounters:  06/01/23 95%  10/10/22 93%  09/23/21 94%      Physical Exam Vitals and nursing note reviewed.  Constitutional:      General: He is not in acute distress.    Appearance: Normal appearance. He is well-developed.  HENT:     Head: Normocephalic and atraumatic.  Eyes:     General: No scleral icterus.       Right eye: No discharge.         Left eye: No discharge.  Cardiovascular:     Rate and Rhythm: Normal rate and regular rhythm.     Heart sounds: No murmur heard. Pulmonary:     Effort: Pulmonary effort is normal. No respiratory distress.     Breath sounds: Normal breath sounds.  Musculoskeletal:        General: Normal range of motion.     Cervical back: Normal range of motion and neck supple.     Right lower leg: No edema.     Left lower leg: No edema.  Skin:    General: Skin is warm and dry.  Neurological:     Mental Status: He is alert and oriented to  person, place, and time.  Psychiatric:        Mood and Affect: Mood normal.        Behavior: Behavior normal.        Thought Content: Thought content normal.        Judgment: Judgment normal.      No results found for any visits on 06/01/23.  Last CBC Lab Results  Component Value Date   WBC 7.3 10/10/2022   HGB 15.2 10/10/2022   HCT 45.3 10/10/2022   MCV 84.0 10/10/2022   MCH 28.2 10/10/2022   RDW 14.4 10/10/2022   PLT 331 10/10/2022   Last metabolic panel Lab Results  Component Value Date   GLUCOSE 121 (H) 10/10/2022   NA 138 10/10/2022   K 3.9 10/10/2022   CL 100 10/10/2022   CO2 25 10/10/2022   BUN 15 10/10/2022   CREATININE 0.97 10/10/2022   GFR 61.47 09/23/2021   CALCIUM 9.8 10/10/2022   PROT 6.9 10/10/2022   ALBUMIN 4.7 09/23/2021   BILITOT 0.9 10/10/2022   ALKPHOS 54 09/23/2021   AST 34 10/10/2022   ALT 57 (H) 10/10/2022   Last lipids Lab Results  Component Value Date   CHOL 194 10/10/2022   HDL 36 (L) 10/10/2022   LDLCALC 133 (H) 10/10/2022   LDLDIRECT 188.8 07/25/2009   TRIG 130 10/10/2022   CHOLHDL 5.4 (H) 10/10/2022   Last hemoglobin A1c No results found for: HGBA1C Last thyroid  functions Lab Results  Component Value Date   TSH 1.56 10/10/2022   Last vitamin D  No results found for: 25OHVITD2, 25OHVITD3, VD25OH Last vitamin B12 and Folate Lab Results  Component Value Date   VITAMINB12 344 12/24/2017       The 10-year ASCVD risk score (Arnett DK, et al., 2019) is: 19.9%    Assessment & Plan:   Problem List Items Addressed This Visit       Unprioritized   Anxiety   Relevant Medications   ALPRAZolam  (XANAX ) 0.5 MG tablet   Essential hypertension   High today--- pt took his bp med late He will check bp when he is home and send it to us        Relevant Medications   tirzepatide  (ZEPBOUND ) 7.5 MG/0.5ML Pen   Other Relevant Orders   Lipid panel   CBC with Differential/Platelet   Comprehensive metabolic panel   Hemoglobin A1c   Microalbumin / creatinine urine ratio   Other Visit Diagnoses       Immunization due    -  Primary   Relevant Orders   Flu vaccine trivalent PF, 6mos and older(Flulaval,Afluria,Fluarix,Fluzone) (Completed)     Hyperglycemia       Relevant Medications   tirzepatide  (ZEPBOUND ) 7.5 MG/0.5ML Pen   Other Relevant Orders   Comprehensive metabolic panel   Hemoglobin A1c   Microalbumin / creatinine urine ratio     Nausea       Relevant Medications   ondansetron  (ZOFRAN ) 4 MG tablet     Assessment and Plan    Type 2 Diabetes Mellitus   Recent blood sugar readings indicate prediabetes with a level of 121 mg/dL. He has previously tolerated semaglutide and tirzepatide  well, achieving weight loss. We discussed starting Zepbound  (tirzepatide ) for both weight management and glycemic control. He is aware of the cost and has insurance coverage for Zepbound  through prior authorization. Zepbound , a GLP-1 receptor agonist similar to Mounjaro, is administered via pen, and he prefers to start at 7.5 mg due to previous tolerance of  higher doses. We will start Zepbound  at 7.5 mg, submit a prior authorization for it, and monitor his blood sugar levels.  Hypertension   His office blood pressure was significantly elevated at 179/101 mmHg compared to home readings of 138/98 mmHg, likely because he had not taken his antihypertensive medication before the visit. We discussed the  importance of consistent medication adherence and the need to monitor his blood pressure. We will recheck his blood pressure after administering the flu shot, and he will monitor his blood pressure at home and report the readings.  General Health Maintenance   He agreed to receive a flu shot but expressed reluctance towards further COVID-19 vaccinations due to personal and family experiences, including concerns about potential adverse effects. We will administer the flu shot.  Follow-up   We will follow up on his blood pressure readings and schedule a follow-up appointment if necessary.        No follow-ups on file.    Cyree Chuong R Lowne Chase, DO

## 2023-06-01 NOTE — Assessment & Plan Note (Signed)
 High today--- pt took his bp med late He will check bp when he is home and send it to [place]

## 2023-06-02 ENCOUNTER — Telehealth: Payer: Self-pay

## 2023-06-02 NOTE — Telephone Encounter (Signed)
 Tried completing PA for Zepbound via Covermymeds; KEY: [id number]  Wt loss meds no longer covered. Plan exclusion.

## 2023-06-02 NOTE — Telephone Encounter (Signed)
 Copied from CRM [id number]. Topic: Clinical - Prescription Issue >> Jun 02, 2023 10:59 AM Corin V wrote: Reason for CRM: Patient is calling in stating that the pharmacy stated his Zepbound  Rx needs a prior authorization. Please let patient know when it has been approved and sent to the pharmacy.

## 2023-06-03 ENCOUNTER — Telehealth: Payer: Self-pay

## 2023-06-03 NOTE — Telephone Encounter (Signed)
 Copied from CRM [id number]. Topic: Clinical - Prescription Issue >> Jun 02, 2023  4:39 PM Viola F wrote: Reason for CRM: Patient called to follow up on tirzepatide  (ZEPBOUND ) 7.5 MG/0.5ML Pen - says it's not covered and would like a alternative medication sent to pharmacy that will be covered

## 2023-06-11 NOTE — Telephone Encounter (Signed)
Copied from CRM [id number]. Topic: Clinical - Prescription Issue >> Jun 11, 2023 11:48 AM Fuller Mandril wrote: Reason for CRM: Patient calling to get status update on prescription issue. Is having trouble getting medication from pharmacy. Pharmacy states PA required. Per noted PA has been denied. Pateint would like to discuss next steps. Thank You.

## 2023-06-12 NOTE — Telephone Encounter (Signed)
Pt called. LVM advising that PA was denied and the next steps would be to call insurance to see what is covered or a possible referral to healthy weight and wellness. Pt advised to call back or send mychart message

## 2023-06-17 ENCOUNTER — Telehealth: Payer: Self-pay | Admitting: Family Medicine

## 2023-06-17 NOTE — Telephone Encounter (Unsigned)
Copied from CRM [id number]. Topic: Clinical - Prescription Issue >> Jun 17, 2023  3:07 PM Leavy Cella D wrote: Reason for CRM: Patient called stating he called last week regarding PA that is needed for medication ondansetron (ZOFRAN) 4 MG tablet . Patient stated he spoke to someone last week regarding status and has not heard back from anyone .Please contact patient with update

## 2023-06-17 NOTE — Telephone Encounter (Signed)
   Pt can get zofran 4mg  tablets through [facility] fairly cheaply

## 2023-09-04 ENCOUNTER — Other Ambulatory Visit: Payer: Self-pay | Admitting: Family Medicine

## 2023-09-04 DIAGNOSIS — F419 Anxiety disorder, unspecified: Secondary | ICD-10-CM

## 2023-10-05 ENCOUNTER — Other Ambulatory Visit: Payer: Self-pay | Admitting: Family Medicine

## 2023-10-05 DIAGNOSIS — R600 Localized edema: Secondary | ICD-10-CM

## 2023-10-05 DIAGNOSIS — I10 Essential (primary) hypertension: Secondary | ICD-10-CM

## 2023-10-09 ENCOUNTER — Other Ambulatory Visit: Payer: Self-pay | Admitting: Family Medicine

## 2023-10-09 DIAGNOSIS — I10 Essential (primary) hypertension: Secondary | ICD-10-CM

## 2023-10-09 DIAGNOSIS — R21 Rash and other nonspecific skin eruption: Secondary | ICD-10-CM

## 2023-10-09 DIAGNOSIS — F321 Major depressive disorder, single episode, moderate: Secondary | ICD-10-CM

## 2023-10-09 DIAGNOSIS — F419 Anxiety disorder, unspecified: Secondary | ICD-10-CM

## 2023-10-09 NOTE — Telephone Encounter (Signed)
 Last Fill: ALPRAZolam : 06/01/23      AmLODipine -valsartan : 10/10/22     Escitalopram : 10/10/22     Clotrimazole -betamethasone : 10/10/22  Last OV: 06/01/23 Next OV: None Scheduled  Routing to provider for review/authorization.      Copied from CRM [id number]. Topic: Clinical - Medication Refill >> Oct 09, 2023 10:30 AM Brad Ellis wrote: Medication: ALPRAZolam  (XANAX )  amLODipine -valsartan   carvedilol  (COREG ) furosemide  (LASIX ) escitalopram  (LEXAPRO ) clotrimazole -betamethasone   Has the patient contacted their pharmacy? Yes Patient stated he called the pharmacy last week.  (Agent: If no, request that the patient contact the pharmacy for the refill. If patient does not wish to contact the pharmacy document the reason why and proceed with request.) (Agent: If yes, when and what did the pharmacy advise?)  This is the patient's preferred pharmacy:  [id number] Jonette Nestle, [place] - [address] AT [facility] [address] Brad Ellis [place] [address] Phone: [phone] Fax: [phone]  Is this the correct pharmacy for this prescription? Yes If no, delete pharmacy and type the correct one.   Has the prescription been filled recently? No  Is the patient out of the medication? Yes Patient called stating she has a few tablets left of Carvedilol  and Lexapro  but is completely out of his other medications.  Has the patient been seen for an appointment in the last year OR does the patient have an upcoming appointment? Yes  Can we respond through MyChart? Yes  Agent: Please be advised that Rx refills may take up to 3 business days. We ask that you follow-up with your pharmacy.

## 2023-10-09 NOTE — Telephone Encounter (Unsigned)
 Copied from CRM [id number]. Topic: Clinical - Medication Refill >> Oct 09, 2023 10:30 AM Caliyah H wrote: Medication: ALPRAZolam  (XANAX )  amLODipine -valsartan   carvedilol  (COREG ) furosemide  (LASIX ) escitalopram  (LEXAPRO ) clotrimazole -betamethasone   Has the patient contacted their pharmacy? Yes Patient stated he called the pharmacy last week.  (Agent: If no, request that the patient contact the pharmacy for the refill. If patient does not wish to contact the pharmacy document the reason why and proceed with request.) (Agent: If yes, when and what did the pharmacy advise?)  This is the patient's preferred pharmacy:  [facility] #[id number] Jonette Nestle, [place] - [address] AT [facility] & [address] Daphane Dynes [place] [address] Phone: [phone] Fax: [phone]  Is this the correct pharmacy for this prescription? Yes If no, delete pharmacy and type the correct one.   Has the prescription been filled recently? No  Is the patient out of the medication? Yes Patient called stating she has a few tablets left of Carvedilol  and Lexapro  but is completely out of his other medications.  Has the patient been seen for an appointment in the last year OR does the patient have an upcoming appointment? Yes  Can we respond through MyChart? Yes  Agent: Please be advised that Rx refills may take up to 3 business days. We ask that you follow-up with your pharmacy.

## 2023-10-10 ENCOUNTER — Other Ambulatory Visit: Payer: Self-pay | Admitting: Family Medicine

## 2023-10-10 DIAGNOSIS — F419 Anxiety disorder, unspecified: Secondary | ICD-10-CM

## 2023-10-12 MED ORDER — ALPRAZOLAM 0.5 MG PO TABS: ORAL_TABLET | ORAL | 1 refills | Status: DC

## 2023-10-12 MED ORDER — ESCITALOPRAM OXALATE 20 MG PO TABS: 20.0000 mg | ORAL_TABLET | Freq: Every day | ORAL | 3 refills | Status: AC

## 2023-10-12 MED ORDER — CLOTRIMAZOLE-BETAMETHASONE 1-0.05 % EX CREA
1.0000 | TOPICAL_CREAM | Freq: Every day | CUTANEOUS | 0 refills | Status: AC
Start: 2023-10-12 — End: ?

## 2023-10-12 MED ORDER — AMLODIPINE BESYLATE-VALSARTAN 10-320 MG PO TABS: 1.0000 | ORAL_TABLET | Freq: Every day | ORAL | 3 refills | Status: AC

## 2023-10-12 NOTE — Telephone Encounter (Signed)
 Requesting: Xanax  0.5 mg  Contract: 10/10/2022 UDS: 10/10/2022 Last Visit: 06/01/2023 Next Visit: N/A Last Refill: 06/01/2023  Please Advise

## 2023-11-17 ENCOUNTER — Emergency Department (HOSPITAL_BASED_OUTPATIENT_CLINIC_OR_DEPARTMENT_OTHER)

## 2023-11-17 ENCOUNTER — Other Ambulatory Visit: Payer: Self-pay

## 2023-11-17 ENCOUNTER — Encounter (HOSPITAL_BASED_OUTPATIENT_CLINIC_OR_DEPARTMENT_OTHER): Payer: Self-pay | Admitting: Radiology

## 2023-11-17 ENCOUNTER — Emergency Department (HOSPITAL_BASED_OUTPATIENT_CLINIC_OR_DEPARTMENT_OTHER)
Admission: EM | Admit: 2023-11-17 | Discharge: 2023-11-17 | Disposition: A | Discharge status: Home / Self Care | Attending: Emergency Medicine | Admitting: Emergency Medicine

## 2023-11-17 DIAGNOSIS — I10 Essential (primary) hypertension: Secondary | ICD-10-CM | POA: Diagnosis not present

## 2023-11-17 DIAGNOSIS — R009 Unspecified abnormalities of heart beat: Secondary | ICD-10-CM | POA: Diagnosis present

## 2023-11-17 DIAGNOSIS — Z7901 Long term (current) use of anticoagulants: Secondary | ICD-10-CM | POA: Insufficient documentation

## 2023-11-17 DIAGNOSIS — I480 Paroxysmal atrial fibrillation: Secondary | ICD-10-CM | POA: Diagnosis not present

## 2023-11-17 DIAGNOSIS — Z79899 Other long term (current) drug therapy: Secondary | ICD-10-CM | POA: Insufficient documentation

## 2023-11-17 LAB — CBC WITH DIFFERENTIAL/PLATELET
Abs Immature Granulocytes: 0.01 10*3/uL (ref 0.00–0.07)
Basophils Absolute: 0 10*3/uL (ref 0.0–0.1)
Basophils Relative: 0 %
Eosinophils Absolute: 0.3 10*3/uL (ref 0.0–0.5)
Eosinophils Relative: 5 %
HCT: 47.7 % (ref 39.0–52.0)
Hemoglobin: 15.4 g/dL (ref 13.0–17.0)
Immature Granulocytes: 0 %
Lymphocytes Relative: 38 %
Lymphs Abs: 2.7 10*3/uL (ref 0.7–4.0)
MCH: 25.1 pg — ABNORMAL LOW (ref 26.0–34.0)
MCHC: 32.3 g/dL (ref 30.0–36.0)
MCV: 77.8 fL — ABNORMAL LOW (ref 80.0–100.0)
Monocytes Absolute: 0.8 10*3/uL (ref 0.1–1.0)
Monocytes Relative: 12 %
Neutro Abs: 3.2 10*3/uL (ref 1.7–7.7)
Neutrophils Relative %: 45 %
Platelets: 280 10*3/uL (ref 150–400)
RBC: 6.13 MIL/uL — ABNORMAL HIGH (ref 4.22–5.81)
RDW: 18.6 % — ABNORMAL HIGH (ref 11.5–15.5)
WBC: 7.1 10*3/uL (ref 4.0–10.5)
nRBC: 0 % (ref 0.0–0.2)

## 2023-11-17 LAB — COMPREHENSIVE METABOLIC PANEL WITH GFR
ALT: 178 U/L — ABNORMAL HIGH (ref 0–44)
AST: 120 U/L — ABNORMAL HIGH (ref 15–41)
Albumin: 4.3 g/dL (ref 3.5–5.0)
Alkaline Phosphatase: 43 U/L (ref 38–126)
Anion gap: 14 (ref 5–15)
BUN: 20 mg/dL (ref 6–20)
CO2: 24 mmol/L (ref 22–32)
Calcium: 9.4 mg/dL (ref 8.9–10.3)
Chloride: 97 mmol/L — ABNORMAL LOW (ref 98–111)
Creatinine, Ser: 1.12 mg/dL (ref 0.61–1.24)
GFR, Estimated: >60 mL/min (ref >60)
Glucose, Bld: 133 mg/dL — ABNORMAL HIGH (ref 70–99)
Potassium: 3.6 mmol/L (ref 3.5–5.1)
Sodium: 135 mmol/L (ref 135–145)
Total Bilirubin: 1.2 mg/dL (ref 0.0–1.2)
Total Protein: 7.3 g/dL (ref 6.5–8.1)

## 2023-11-17 LAB — MAGNESIUM: Magnesium: 1.6 mg/dL — ABNORMAL LOW (ref 1.7–2.4)

## 2023-11-17 MED ORDER — APIXABAN 5 MG PO TABS: 5.0000 mg | ORAL_TABLET | Freq: Two times a day (BID) | ORAL | 0 refills | Status: AC

## 2023-11-17 NOTE — ED Triage Notes (Signed)
 States apple watched alerted one episode of afib from Saturday. Denies Symptoms, No hx.

## 2023-11-17 NOTE — ED Provider Notes (Addendum)
 [place] EMERGENCY DEPARTMENT AT [facility] Provider Note   CSN: [id number] Arrival date & time: 11/17/23  1028     Patient presents with: Irregular Heart Beat   Brad Ellis is a 58 y.o. male.   Patient is a 58 year old male with a history of hypertension on 3 different hypertensive medications who is presenting today reporting that Brad Ellis thinks Brad Ellis may be in atrial fibrillation.  Patient wears an Scientist, physiological and reports since Sunday it has been alerting him that Brad Ellis has been in A-fib.  Patient reports that Brad Ellis is felt completely fine has had no symptoms.  No chest pain, shortness of breath, fatigue, dizziness, palpitations.  No prior history of atrial fibrillation.  Other than hypertension no other known cardiac history.  Brad Ellis denies tobacco use or drugs.  Drinks alcohol occasionally.  No recent medication changes and Brad Ellis has been compliant with his medications.  No recent travel, unilateral leg swelling or numbness.  The history is provided by the patient.       Prior to Admission medications   Medication Sig Start Date End Date Taking? Authorizing Provider  anastrozole (ARIMIDEX) 1 MG tablet Take 1 mg by mouth once a week. 09/26/23  Yes [provider]  apixaban (ELIQUIS) 5 MG TABS tablet Take 1 tablet (5 mg total) by mouth 2 (two) times daily. 11/17/23 12/17/23 Yes Hurley Blevins, Benton, MD  ALPRAZolam  (XANAX ) 0.5 MG tablet TAKE 1 TABLET(0.5 MG) BY MOUTH THREE TIMES DAILY AS NEEDED 10/12/23   Antonio Meth, Yvonne R, DO  amLODipine -valsartan  (EXFORGE ) 10-320 MG tablet Take 1 tablet by mouth daily. 10/12/23   Antonio Meth Rockers R, DO  carvedilol  (COREG ) 12.5 MG tablet TAKE 1 TABLET BY MOUTH TWICE DAILY 10/05/23   Antonio Meth, Yvonne R, DO  clotrimazole -betamethasone  (LOTRISONE ) cream Apply 1 Application topically daily. 10/12/23   Antonio Meth Rockers JONELLE, DO  diclofenac  (VOLTAREN ) 75 MG EC tablet Take 1 tablet (75 mg total) by mouth 2 (two) times daily. 06/28/18   Saguier, [place], PA-C   escitalopram  (LEXAPRO ) 20 MG tablet Take 1 tablet (20 mg total) by mouth daily. 10/12/23   Antonio Meth Rockers R, DO  furosemide  (LASIX ) 40 MG tablet TAKE 1 TABLET(40 MG) BY MOUTH DAILY 10/05/23   Antonio Meth, Yvonne R, DO  methocarbamol  (ROBAXIN ) 500 MG tablet Take 1 tablet (500 mg total) by mouth 4 (four) times daily. 09/23/21   Antonio Meth Rockers JONELLE, DO  ondansetron  (ZOFRAN ) 4 MG tablet Take 1 tablet (4 mg total) by mouth every 8 (eight) hours as needed for nausea or vomiting. 06/01/23   Antonio Meth Rockers JONELLE, DO  tirzepatide  (ZEPBOUND ) 7.5 MG/0.5ML Pen Inject 7.5 mg into the skin once a week. 06/01/23   Lowne Chase, Yvonne R, DO    Allergies: Patient has no known allergies.    Review of Systems  Updated Vital Signs BP 127/77   Pulse 78   Resp 18   SpO2 95%   Physical Exam Vitals and nursing note reviewed.  Constitutional:      General: Brad Ellis is not in acute distress.    Appearance: Brad Ellis is well-developed.  HENT:     Head: Normocephalic and atraumatic.   Eyes:     Conjunctiva/sclera: Conjunctivae normal.     Pupils: Pupils are equal, round, and reactive to light.    Cardiovascular:     Rate and Rhythm: Normal rate. Rhythm irregularly irregular.     Heart sounds: No murmur heard. Pulmonary:     Effort: Pulmonary  effort is normal. No respiratory distress.     Breath sounds: Normal breath sounds. No wheezing or rales.  Abdominal:     General: There is no distension.     Palpations: Abdomen is soft.     Tenderness: There is no abdominal tenderness. There is no guarding or rebound.   Musculoskeletal:        General: No tenderness. Normal range of motion.     Cervical back: Normal range of motion and neck supple.     Right lower leg: No edema.     Left lower leg: No edema.   Skin:    General: Skin is warm and dry.     Findings: No erythema or rash.   Neurological:     Mental Status: Brad Ellis is alert and oriented to person, place, and time.   Psychiatric:        Behavior: Behavior  normal.     (all labs ordered are listed, but only abnormal results are displayed) Labs Reviewed  CBC WITH DIFFERENTIAL/PLATELET - Abnormal; Notable for the following components:      Result Value   RBC 6.13 (*)    MCV 77.8 (*)    MCH 25.1 (*)    RDW 18.6 (*)    All other components within normal limits  COMPREHENSIVE METABOLIC PANEL WITH GFR - Abnormal; Notable for the following components:   Chloride 97 (*)    Glucose, Bld 133 (*)    AST 120 (*)    ALT 178 (*)    All other components within normal limits  MAGNESIUM - Abnormal; Notable for the following components:   Magnesium 1.6 (*)    All other components within normal limits    EKG: EKG Interpretation Date/Time:  Tuesday November 17 2023 10:48:46 EDT Ventricular Rate:  80 PR Interval:    QRS Duration:  99 QT Interval:  379 QTC Calculation: 438 R Axis:   -5  Text Interpretation: new Atrial fibrillation Abnormal R-wave progression, late transition Probable left ventricular hypertrophy Anterior ST elevation, probably due to LVH Confirmed by Doretha Folks ([id number]) on 11/17/2023 11:14:25 AM  Radiology: [facility] Chest Port 1 View Result Date: 11/17/2023 CLINICAL DATA:  Atrial fibrillation. EXAM: PORTABLE CHEST 1 VIEW COMPARISON:  None Available. FINDINGS: Underinflation. No consolidation, pneumothorax or effusion. No edema. Normal cardiopericardial silhouette. Films are under penetrated. Overlapping cardiac leads. Degenerative changes along the spine. IMPRESSION: No acute cardiopulmonary disease.  Limited radiographs. Electronically Signed   By: Ranell Bring M.D.   On: 11/17/2023 12:22     Procedures   Medications Ordered in the ED - No data to display                                  Medical Decision Making Amount and/or Complexity of Data Reviewed Labs: ordered. Decision-making details documented in ED Course. Radiology: ordered and independent interpretation performed. Decision-making details documented in ED  Course. ECG/medicine tests: ordered and independent interpretation performed. Decision-making details documented in ED Course.  Risk Prescription drug management.   Pt with multiple medical problems and comorbidities and presenting today with a complaint that caries a high risk for morbidity and mortality.  Here today with concern of atrial fibrillation based on his Apple Watch notification.  Patient is completely asymptomatic at this time and hemodynamically stable.  Heart rate based on continuous cardiac monitoring currently while patient is in the room and my independent interpretation shows atrial  fibrillation that is rate controlled less than 100.  Patient is not chronically anticoagulated.  Brad Ellis is already on beta-blocker therapy.  Based on risk factors of hypertension patient is a CHA2DS2-VASc of 1.  Will get lab work to ensure no reversible causes.  I independently interpreted patient's EKG which shows atrial fibrillation rate controlled  I independently interpreted patient's labs and Pt's CBC wnl.  CMP with elevated LFTs and magnesium slightly low at 1.6. I have independently visualized and interpreted pt's images today.  Chest x-ray within normal limits.  On return to the room patient has converted back to sinus rhythm.  Discussed the elevated LFTs and Brad Ellis reports for the last week Brad Ellis has been on a bachelor fishing trip and drink a lot more alcohol than normal and could be the cause of him going into A-fib.  Brad Ellis does not really want to take Eliquis but Brad Ellis was given a prescription because Brad Ellis can follow his heart rate with his Apple Watch and did recommend that Brad Ellis start Eliquis if Brad Ellis continues to go in and out of A-fib.  Will do an ambulatory A-fib referral as well.  At this time patient is stable for discharge.      Final diagnoses:  PAF (paroxysmal atrial fibrillation) [facility])    ED Discharge Orders          Ordered    apixaban (ELIQUIS) 5 MG TABS tablet  2 times daily        11/17/23 1213     Amb Referral to AFIB Clinic        11/17/23 1214               Doretha Folks, MD 11/17/23 1214    Doretha Folks, MD 11/17/23 1227

## 2023-11-17 NOTE — Discharge Instructions (Addendum)
 Continue your current medications.  If you continue to go in and out of atrial fibrillation start the blood thinner.

## 2023-12-23 ENCOUNTER — Other Ambulatory Visit: Payer: Self-pay | Admitting: Family Medicine

## 2023-12-23 DIAGNOSIS — F419 Anxiety disorder, unspecified: Secondary | ICD-10-CM

## 2023-12-24 NOTE — Telephone Encounter (Signed)
 Requesting: Xanax  Contract: 10/10/22 UDS: 10/10/22 Last OV: 06/01/23 Next OV: n/a Last Refill: 10/12/23, #60--1 RF Database:   Please advise

## 2024-01-13 ENCOUNTER — Other Ambulatory Visit: Payer: Self-pay | Admitting: Family Medicine

## 2024-01-13 ENCOUNTER — Other Ambulatory Visit: Payer: Self-pay | Admitting: Family

## 2024-01-13 DIAGNOSIS — F419 Anxiety disorder, unspecified: Secondary | ICD-10-CM

## 2024-01-14 ENCOUNTER — Other Ambulatory Visit: Payer: Self-pay | Admitting: Family Medicine

## 2024-01-14 DIAGNOSIS — F419 Anxiety disorder, unspecified: Secondary | ICD-10-CM

## 2024-01-14 NOTE — Telephone Encounter (Signed)
 12/24/23 60 tabs/0 RF

## 2024-01-14 NOTE — Telephone Encounter (Signed)
 Requesting: Xanax   Contract: 09/2022 UDS:    Last OV: 06/01/2023 Next OV: n/a Last Refill: 12/24/2023, #60--0 RF Database:   Please advise

## 2024-01-15 NOTE — Telephone Encounter (Signed)
 Rx already sent on 01/14/24

## 2024-04-13 ENCOUNTER — Other Ambulatory Visit: Payer: Self-pay | Admitting: Family Medicine

## 2024-04-13 DIAGNOSIS — I10 Essential (primary) hypertension: Secondary | ICD-10-CM

## 2024-04-13 DIAGNOSIS — R600 Localized edema: Secondary | ICD-10-CM

## 2024-06-06 ENCOUNTER — Other Ambulatory Visit: Payer: Self-pay | Admitting: Family Medicine

## 2024-06-06 DIAGNOSIS — F419 Anxiety disorder, unspecified: Secondary | ICD-10-CM

## 2024-06-06 MED ORDER — ALPRAZOLAM 0.5 MG PO TABS: ORAL_TABLET | ORAL | 2 refills | Status: AC

## 2024-06-06 NOTE — Telephone Encounter (Unsigned)
 Copied from CRM #[id number]. Topic: Clinical - Medication Refill >> Jun 06, 2024  3:25 PM Shereese L wrote: Medication:  ALPRAZolam  (XANAX ) 0.5 MG tablet    Has the patient contacted their pharmacy? Yes (Agent: If no, request that the patient contact the pharmacy for the refill. If patient does not wish to contact the pharmacy document the reason why and proceed with request.) (Agent: If yes, when and what did the pharmacy advise?)  This is the patient's preferred pharmacy:  [id number] [address] [facility] - [address] [place] [address] Phone: [phone] Fax: [phone]  Is this the correct pharmacy for this prescription? Yes If no, delete pharmacy and type the correct one.   Has the prescription been filled recently? Yes  Is the patient out of the medication? Yes  Has the patient been seen for an appointment in the last year OR does the patient have an upcoming appointment? Yes  Can we respond through MyChart? Yes  Agent: Please be advised that Rx refills may take up to 3 business days. We ask that you follow-up with your pharmacy.

## 2024-06-06 NOTE — Telephone Encounter (Unsigned)
 Copied from CRM #[id number]. Topic: Clinical - Medication Refill >> Jun 06, 2024  3:26 PM Shereese L wrote: Medication: ALPRAZolam  (XANAX ) 0.5 MG tablet  Has the patient contacted their pharmacy? Yes (Agent: If no, request that the patient contact the pharmacy for the refill. If patient does not wish to contact the pharmacy document the reason why and proceed with request.) (Agent: If yes, when and what did the pharmacy advise?)  This is the patient's preferred pharmacy:  [id number] [address] [facility] - [address] [place] [address] Phone: [phone] Fax: [phone]  Is this the correct pharmacy for this prescription? Yes If no, delete pharmacy and type the correct one.   Has the prescription been filled recently? Yes  Is the patient out of the medication? Yes  Has the patient been seen for an appointment in the last year OR does the patient have an upcoming appointment? Yes  Can we respond through MyChart? Yes  Agent: Please be advised that Rx refills may take up to 3 business days. We ask that you follow-up with your pharmacy.

## 2024-06-06 NOTE — Telephone Encounter (Signed)
 Requesting: alprazolam  0.5mg  Contract: 10/10/22 UDS: 10/10/22 Last Visit: 06/01/23 Next Visit: None Last Refill: 01/14/24 #60 and 2rf   Please Advise
